# Patient Record
Sex: Female | Born: 1980 | Hispanic: No | Smoking: Former smoker
Health system: Southern US, Community
[De-identification: ages and names within clinical notes are randomized; demographics above are authoritative.]

## PROBLEM LIST (undated history)

## (undated) DIAGNOSIS — D219 Benign neoplasm of connective and other soft tissue, unspecified: Secondary | ICD-10-CM

## (undated) HISTORY — DX: Benign neoplasm of connective and other soft tissue, unspecified: D21.9

---

## 2004-02-09 ENCOUNTER — Other Ambulatory Visit: Admission: RE | Admit: 2004-02-09 | Discharge: 2004-02-09 | Payer: Self-pay | Admitting: Family Medicine

## 2010-10-22 ENCOUNTER — Encounter: Payer: Self-pay | Admitting: Orthopaedic Surgery

## 2020-04-12 ENCOUNTER — Telehealth: Payer: Self-pay | Admitting: *Deleted

## 2020-04-12 NOTE — Telephone Encounter (Signed)
Returned call from 04/11/2020 at 9:55 AM. Left patient a message to call and schedule at [facility] (due to address) or [place] location.

## 2020-04-19 ENCOUNTER — Telehealth: Payer: Self-pay | Admitting: *Deleted

## 2020-04-19 NOTE — Telephone Encounter (Signed)
Left patient a message that I will call back to schedule her New OB appointment, phones are forwarded for lunch at the time of call.

## 2020-04-29 ENCOUNTER — Other Ambulatory Visit (HOSPITAL_COMMUNITY)
Admission: RE | Admit: 2020-04-29 | Discharge: 2020-04-29 | Disposition: A | Discharge status: Home / Self Care | Payer: Medicaid Other | Source: Ambulatory Visit

## 2020-04-29 ENCOUNTER — Other Ambulatory Visit: Payer: Self-pay

## 2020-04-29 ENCOUNTER — Ambulatory Visit (INDEPENDENT_AMBULATORY_CARE_PROVIDER_SITE_OTHER): Payer: Medicaid Other

## 2020-04-29 DIAGNOSIS — O09529 Supervision of elderly multigravida, unspecified trimester: Secondary | ICD-10-CM

## 2020-04-29 DIAGNOSIS — Z3480 Encounter for supervision of other normal pregnancy, unspecified trimester: Secondary | ICD-10-CM | POA: Diagnosis present

## 2020-04-29 NOTE — Progress Notes (Signed)
Subjective:   Laurie Scott is a 39 y.o. [id number] at Unknown by early ultrasound being seen today for her first obstetrical visit.  Her obstetrical history is significant for advanced maternal age and has Supervision of other normal pregnancy, antepartum and AMA (advanced maternal age) multigravida 35+ on their problem list.. Patient does intend to breast feed. Pregnancy history fully reviewed.  Patient reports no complaints.  HISTORY: OB History  Gravida Para Term Preterm AB Living  6 2 2  0 3 2  SAB TAB Ectopic Multiple Live Births  1 2 0 0 0    # Outcome Date GA Lbr Len/2nd Weight Sex Delivery Anes PTL Lv  6 Current           5 SAB           4 TAB           3 TAB           2 Term           1 Term            History reviewed. No pertinent past medical history. History reviewed. No pertinent surgical history. Family History  Problem Relation Age of Onset  . Breast cancer Other    Social History   Tobacco Use  . Smoking status: Former Research scientist (life sciences)  . Smokeless tobacco: Never Used  Vaping Use  . Vaping Use: Never used  Substance Use Topics  . Alcohol use: Not Currently  . Drug use: Never   Not on File No current outpatient medications on file prior to visit.   No current facility-administered medications on file prior to visit.     Exam   Vitals:   04/29/20 1007  BP: (!) 134/86  Pulse: 94  Weight: 154 lb (69.9 kg)      Uterus:     Pelvic Exam: deferred                       System: General: well-developed, well-nourished female in no acute distress   Breast:  normal appearance, no masses or tenderness   Skin: normal coloration and turgor, no rashes   Neurologic: oriented, normal, negative, normal mood   Extremities: normal strength, tone, and muscle mass, ROM of all joints is normal   HEENT PERRLA, extraocular movement intact and sclera clear, anicteric   Mouth/Teeth mucous membranes moist, pharynx normal without lesions and dental hygiene good    Neck supple and no masses   Cardiovascular: regular rate and rhythm   Respiratory:  no respiratory distress, normal breath sounds   Abdomen: soft, non-tender; bowel sounds normal; no masses,  no organomegaly     Assessment:   Pregnancy: [id number] Patient Active Problem List   Diagnosis Date Noted  . Supervision of other normal pregnancy, antepartum 04/29/2020  . AMA (advanced maternal age) multigravida 35+ 04/29/2020     Plan:  1. Supervision of other normal pregnancy, antepartum - No complaints. Routine care - Patient had early u/s at pregnancy care center. Patient brought copy of what appears to be first trimester ultrasound after appointment and left. Dating at the top is crossed out with marker and "8w" is written above. No measurements on actual image- see scan in chart. Will need more accurate dating confirmed.   - Patient declines NOB labs today stating she hasn't eaten. Will come in for lab only visit ASAP - Discussed genetic screening at length, patient unsure at this time and  will let us know if she wants to have this done.  -BP borderline today, watch closely - Considering waterbirth- reviewed requirements and contraindications  - Culture, OB Urine - GC/Chlamydia probe amp ([place])not at [facility]  2. Antepartum multigravida of advanced maternal age -Discussed aspirin use in pregnancy >12 weeks   Continue prenatal vitamins. Genetic Screening discussed, First trimester screen, Quad screen and NIPS: unsure. Ultrasound discussed; fetal anatomic survey: requested. Problem list reviewed and updated. The nature of [place] with multiple MDs and other Advanced Practice Providers was explained to patient; also emphasized that residents, students are part of our team. Routine obstetric precautions reviewed. Return in about 4 weeks (around 05/27/2020) for Return OB visit.   Wende Mott, CNM 04/29/20 10:30 AM

## 2020-04-29 NOTE — Patient Instructions (Signed)

## 2020-05-01 LAB — CULTURE, OB URINE

## 2020-05-01 LAB — URINE CULTURE, OB REFLEX

## 2020-05-02 ENCOUNTER — Ambulatory Visit: Payer: Medicaid Other

## 2020-05-02 LAB — GC/CHLAMYDIA PROBE AMP (~~LOC~~) NOT AT ARMC
Chlamydia: NEGATIVE
Comment: NEGATIVE
Comment: NORMAL
Neisseria Gonorrhea: NEGATIVE

## 2020-05-26 ENCOUNTER — Encounter: Payer: Self-pay | Admitting: Certified Nurse Midwife

## 2020-05-27 ENCOUNTER — Encounter: Payer: Medicaid Other | Admitting: Certified Nurse Midwife

## 2020-06-17 ENCOUNTER — Encounter: Payer: Self-pay | Admitting: Certified Nurse Midwife

## 2020-06-17 ENCOUNTER — Ambulatory Visit (INDEPENDENT_AMBULATORY_CARE_PROVIDER_SITE_OTHER): Payer: Medicaid Other | Admitting: Certified Nurse Midwife

## 2020-06-17 ENCOUNTER — Other Ambulatory Visit: Payer: Self-pay

## 2020-06-17 ENCOUNTER — Telehealth: Payer: Self-pay

## 2020-06-17 VITALS — BP 108/64 | HR 80 | Ht 64.0 in | Wt 157.0 lb

## 2020-06-17 DIAGNOSIS — Z3480 Encounter for supervision of other normal pregnancy, unspecified trimester: Secondary | ICD-10-CM

## 2020-06-17 DIAGNOSIS — O09522 Supervision of elderly multigravida, second trimester: Secondary | ICD-10-CM

## 2020-06-17 NOTE — Progress Notes (Signed)
LM on voicemail to call office to schedule her PN labs before her anatomy scans.

## 2020-06-17 NOTE — Telephone Encounter (Signed)
Left voicemail on pt's phone letting her know that when she was in the office scheduling her next appt I accidentally scheduled her 3 weeks from today when it needed to be 4 weeks from today. I let pt know that I changed appt from 10/8 to 10/15 at the same time to stay on prenatal schedule.Asked pt to call office back if that day and time didn't work for her.  MyChart message also sent.

## 2020-06-17 NOTE — Progress Notes (Signed)
Subjective:  Laurie Scott is a 39 y.o. [id number] at [redacted]w[redacted]d being seen today for ongoing prenatal care.  She is currently monitored for the following issues for this high-risk pregnancy and has Supervision of other normal pregnancy, antepartum and AMA (advanced maternal age) multigravida 35+ on their problem list.  Patient reports no complaints.  Contractions: Not present. Vag. Bleeding: None.  Movement: Present. Denies leaking of fluid.   The following portions of the patient's history were reviewed and updated as appropriate: allergies, current medications, past family history, past medical history, past social history, past surgical history and problem list. Problem list updated.  Objective:   Vitals:   06/17/20 0918 06/17/20 0926  BP: 108/64   Pulse: 80   Weight: 157 lb (71.2 kg)   Height:  5\' 4"  (1.626 m)    Fetal Status: Fetal Heart Rate (bpm): 141 Fundal Height: 19 cm Movement: Present     General:  Alert, oriented and cooperative. Patient is in no acute distress.  Skin: Skin is warm and dry. No rash noted.   Cardiovascular: Normal heart rate noted  Respiratory: Normal respiratory effort, no problems with respiration noted  Abdomen: Soft, gravid, appropriate for gestational age. Pain/Pressure: Absent     Pelvic: Vag. Bleeding: None Vag D/C Character: Thick   Cervical exam deferred        Extremities: Normal range of motion.  Edema: None  Mental Status: Normal mood and affect. Normal behavior. Normal judgment and thought content.   Urinalysis:      Assessment and Plan:  Pregnancy: [id number] at [redacted]w[redacted]d  1. Supervision of other normal pregnancy, antepartum - [place] MFM OB DETAIL +14 WK; Future - NOB labs- declined last visit  2. Multigravida of advanced maternal age in second trimester  Preterm labor symptoms and general obstetric precautions including but not limited to vaginal bleeding, contractions, leaking of fluid and fetal movement were reviewed in detail with the  patient. Please refer to After Visit Summary for other counseling recommendations.  Return in about 4 weeks (around 07/15/2020).   Julianne Handler, CNM

## 2020-06-21 ENCOUNTER — Telehealth: Payer: Self-pay | Admitting: *Deleted

## 2020-06-21 NOTE — Telephone Encounter (Signed)
Left patient an urgent message that her appointment is scheduled for Friday, July 15, 2020 at 9:10 AM as a virtual, MyChart visit with Julianne Handler not Friday, July 08, 2020 with Gaylan Gerold. Patient was left a voicemail and MyChart message on 06/17/2020 as well, with this same information. Patient has not read MyChart message as of this time today.

## 2020-06-30 ENCOUNTER — Ambulatory Visit: Payer: Medicaid Other | Admitting: *Deleted

## 2020-06-30 ENCOUNTER — Other Ambulatory Visit: Payer: Self-pay

## 2020-06-30 ENCOUNTER — Ambulatory Visit: Payer: Medicaid Other | Attending: Certified Nurse Midwife

## 2020-06-30 ENCOUNTER — Other Ambulatory Visit: Payer: Self-pay | Admitting: *Deleted

## 2020-06-30 VITALS — BP 123/86 | HR 88

## 2020-06-30 DIAGNOSIS — Z3480 Encounter for supervision of other normal pregnancy, unspecified trimester: Secondary | ICD-10-CM | POA: Insufficient documentation

## 2020-06-30 DIAGNOSIS — O09529 Supervision of elderly multigravida, unspecified trimester: Secondary | ICD-10-CM

## 2020-06-30 DIAGNOSIS — O409XX0 Polyhydramnios, unspecified trimester, not applicable or unspecified: Secondary | ICD-10-CM | POA: Diagnosis present

## 2020-07-01 DIAGNOSIS — O409XX0 Polyhydramnios, unspecified trimester, not applicable or unspecified: Secondary | ICD-10-CM | POA: Insufficient documentation

## 2020-07-08 ENCOUNTER — Telehealth: Payer: Medicaid Other | Admitting: Certified Nurse Midwife

## 2020-07-15 ENCOUNTER — Telehealth: Payer: Self-pay

## 2020-07-15 ENCOUNTER — Encounter: Payer: Medicaid Other | Admitting: Certified Nurse Midwife

## 2020-07-15 VITALS — Wt 162.0 lb

## 2020-07-15 DIAGNOSIS — O409XX0 Polyhydramnios, unspecified trimester, not applicable or unspecified: Secondary | ICD-10-CM

## 2020-07-15 DIAGNOSIS — Z3A25 25 weeks gestation of pregnancy: Secondary | ICD-10-CM

## 2020-07-15 NOTE — Progress Notes (Signed)
DNKA. Erroneous encounter. This encounter was created in error - please disregard.

## 2020-07-15 NOTE — Telephone Encounter (Signed)
Pt was on MyChart for virtual visit and then logged off. Called pt to ask her to get back on mychart visit. Pt didn't answer. VM left asking pt to call office.

## 2020-07-19 ENCOUNTER — Encounter: Payer: Medicaid Other | Admitting: Certified Nurse Midwife

## 2020-07-28 ENCOUNTER — Ambulatory Visit: Payer: Medicaid Other

## 2020-08-08 ENCOUNTER — Ambulatory Visit: Payer: Medicaid Other

## 2020-08-12 ENCOUNTER — Other Ambulatory Visit: Payer: Self-pay

## 2020-08-12 ENCOUNTER — Ambulatory Visit: Payer: Medicaid Other | Attending: Certified Nurse Midwife

## 2020-08-12 ENCOUNTER — Encounter: Payer: Self-pay | Admitting: *Deleted

## 2020-08-12 ENCOUNTER — Ambulatory Visit: Payer: Medicaid Other | Admitting: *Deleted

## 2020-08-12 DIAGNOSIS — O409XX0 Polyhydramnios, unspecified trimester, not applicable or unspecified: Secondary | ICD-10-CM

## 2020-08-12 DIAGNOSIS — Z362 Encounter for other antenatal screening follow-up: Secondary | ICD-10-CM

## 2020-08-12 DIAGNOSIS — Z3A29 29 weeks gestation of pregnancy: Secondary | ICD-10-CM | POA: Diagnosis not present

## 2020-08-12 DIAGNOSIS — O09523 Supervision of elderly multigravida, third trimester: Secondary | ICD-10-CM | POA: Diagnosis not present

## 2020-08-12 DIAGNOSIS — O3410 Maternal care for benign tumor of corpus uteri, unspecified trimester: Secondary | ICD-10-CM

## 2020-08-12 DIAGNOSIS — O09529 Supervision of elderly multigravida, unspecified trimester: Secondary | ICD-10-CM | POA: Insufficient documentation

## 2020-08-15 ENCOUNTER — Other Ambulatory Visit: Payer: Self-pay | Admitting: *Deleted

## 2020-08-15 DIAGNOSIS — D219 Benign neoplasm of connective and other soft tissue, unspecified: Secondary | ICD-10-CM

## 2020-09-13 ENCOUNTER — Encounter: Payer: Self-pay | Admitting: *Deleted

## 2020-09-13 ENCOUNTER — Ambulatory Visit: Payer: Medicaid Other | Attending: Obstetrics and Gynecology

## 2020-09-13 ENCOUNTER — Other Ambulatory Visit: Payer: Self-pay

## 2020-09-13 ENCOUNTER — Ambulatory Visit: Payer: Medicaid Other | Admitting: *Deleted

## 2020-09-13 DIAGNOSIS — D259 Leiomyoma of uterus, unspecified: Secondary | ICD-10-CM

## 2020-09-13 DIAGNOSIS — O409XX0 Polyhydramnios, unspecified trimester, not applicable or unspecified: Secondary | ICD-10-CM | POA: Diagnosis present

## 2020-09-13 DIAGNOSIS — O3413 Maternal care for benign tumor of corpus uteri, third trimester: Secondary | ICD-10-CM | POA: Diagnosis not present

## 2020-09-13 DIAGNOSIS — D219 Benign neoplasm of connective and other soft tissue, unspecified: Secondary | ICD-10-CM | POA: Diagnosis not present

## 2020-09-13 DIAGNOSIS — O09523 Supervision of elderly multigravida, third trimester: Secondary | ICD-10-CM | POA: Diagnosis not present

## 2020-09-13 DIAGNOSIS — Z362 Encounter for other antenatal screening follow-up: Secondary | ICD-10-CM

## 2020-09-13 DIAGNOSIS — Z3A33 33 weeks gestation of pregnancy: Secondary | ICD-10-CM

## 2020-09-14 ENCOUNTER — Other Ambulatory Visit: Payer: Self-pay | Admitting: *Deleted

## 2020-09-14 DIAGNOSIS — O09523 Supervision of elderly multigravida, third trimester: Secondary | ICD-10-CM

## 2020-10-01 NOTE — L&D Delivery Note (Signed)
OB/GYN Faculty Practice Delivery Note  Laurie Scott is a 40 y.o. [id number] s/p vaginal delivery at [redacted]w[redacted]d. She was admitted for spontaneous onset of labor.  ROM: 0h 61m with clear fluid GBS Status: negative Maximum Maternal Temperature: 98.46F  Labor Progress: Pt presented to MAU for labor check. She rapidly progressed to complete cervical dilation with SROM shortly after arrival to L&D. She then precipitously had an uncomplicated delivery as noted below.  Delivery Date/Time: 10/30/20 at 2123 Delivery: Called to room and patient was complete and pushing. Head delivered ROA. Nuchal cord x1 present; reduced s/p delivery. Shoulder and body delivered in usual fashion. Infant with spontaneous cry, placed on mother's abdomen, dried and stimulated. Cord clamped x 2 after 1-minute delay, and cut by FOB under my direct supervision. Cord blood drawn. Placenta delivered spontaneously with gentle cord traction. Fundus firm with massage and Pitocin. Labia, perineum, vagina, and cervix were inspected, notable for hemostatic right periurethral laceration without need for repair.   Placenta: 3-vessel cord, intact, sent to L&D Complications: none Lacerations: hemostatic right periurethral laceration without repair EBL: 25 ml Analgesia: IV fentanryl  Infant: female  APGARs 9 & 9  weight per medical record  Laurie Scott, Laurie Cranker, MD OB Fellow, Faculty Practice 10/30/2020 9:55 PM

## 2020-10-04 ENCOUNTER — Ambulatory Visit (HOSPITAL_BASED_OUTPATIENT_CLINIC_OR_DEPARTMENT_OTHER): Payer: Medicaid Other

## 2020-10-04 ENCOUNTER — Other Ambulatory Visit (HOSPITAL_COMMUNITY): Payer: Self-pay | Admitting: Maternal & Fetal Medicine

## 2020-10-04 ENCOUNTER — Other Ambulatory Visit: Payer: Self-pay

## 2020-10-04 ENCOUNTER — Ambulatory Visit: Payer: Medicaid Other | Attending: Obstetrics and Gynecology | Admitting: *Deleted

## 2020-10-04 ENCOUNTER — Encounter: Payer: Self-pay | Admitting: *Deleted

## 2020-10-04 DIAGNOSIS — O3413 Maternal care for benign tumor of corpus uteri, third trimester: Secondary | ICD-10-CM

## 2020-10-04 DIAGNOSIS — D259 Leiomyoma of uterus, unspecified: Secondary | ICD-10-CM | POA: Diagnosis not present

## 2020-10-04 DIAGNOSIS — Z3A36 36 weeks gestation of pregnancy: Secondary | ICD-10-CM | POA: Insufficient documentation

## 2020-10-04 DIAGNOSIS — Z362 Encounter for other antenatal screening follow-up: Secondary | ICD-10-CM

## 2020-10-04 DIAGNOSIS — O09523 Supervision of elderly multigravida, third trimester: Secondary | ICD-10-CM

## 2020-10-04 DIAGNOSIS — O409XX0 Polyhydramnios, unspecified trimester, not applicable or unspecified: Secondary | ICD-10-CM

## 2020-10-05 ENCOUNTER — Ambulatory Visit: Payer: Medicaid Other

## 2020-10-12 ENCOUNTER — Ambulatory Visit: Payer: Medicaid Other

## 2020-10-12 ENCOUNTER — Ambulatory Visit: Payer: Medicaid Other | Attending: Obstetrics and Gynecology

## 2020-10-18 ENCOUNTER — Other Ambulatory Visit (HOSPITAL_COMMUNITY)
Admission: RE | Admit: 2020-10-18 | Discharge: 2020-10-18 | Disposition: A | Discharge status: Home / Self Care | Payer: Medicaid Other | Source: Ambulatory Visit | Attending: Advanced Practice Midwife | Admitting: Advanced Practice Midwife

## 2020-10-18 ENCOUNTER — Other Ambulatory Visit: Payer: Self-pay

## 2020-10-18 ENCOUNTER — Ambulatory Visit (INDEPENDENT_AMBULATORY_CARE_PROVIDER_SITE_OTHER): Payer: Medicaid Other | Admitting: Advanced Practice Midwife

## 2020-10-18 ENCOUNTER — Ambulatory Visit: Payer: Medicaid Other

## 2020-10-18 VITALS — BP 120/74 | HR 78 | Wt 172.0 lb

## 2020-10-18 DIAGNOSIS — Z3480 Encounter for supervision of other normal pregnancy, unspecified trimester: Secondary | ICD-10-CM | POA: Insufficient documentation

## 2020-10-18 DIAGNOSIS — O09522 Supervision of elderly multigravida, second trimester: Secondary | ICD-10-CM

## 2020-10-18 DIAGNOSIS — O99613 Diseases of the digestive system complicating pregnancy, third trimester: Secondary | ICD-10-CM

## 2020-10-18 DIAGNOSIS — O0933 Supervision of pregnancy with insufficient antenatal care, third trimester: Secondary | ICD-10-CM

## 2020-10-18 DIAGNOSIS — K5900 Constipation, unspecified: Secondary | ICD-10-CM

## 2020-10-18 DIAGNOSIS — Z3A38 38 weeks gestation of pregnancy: Secondary | ICD-10-CM

## 2020-10-18 MED ORDER — POLYETHYLENE GLYCOL 3350 17 GM/SCOOP PO POWD: 17.0000 g | Freq: Every day | ORAL | 0 refills | Status: DC

## 2020-10-18 MED ORDER — DOCUSATE SODIUM 100 MG PO CAPS: 100.0000 mg | ORAL_CAPSULE | Freq: Two times a day (BID) | ORAL | 2 refills | Status: DC | PRN

## 2020-10-18 NOTE — Progress Notes (Signed)
Pt c/o constipation. 

## 2020-10-18 NOTE — Patient Instructions (Signed)
Things to Try After 37 weeks to Encourage Labor/Get Ready for Labor:   1.  Try the [employer] at [email or web address] daily to improve baby's position and encourage the onset of labor.  2. Walk a little and rest a little every day.  Change positions often.  3. Cervical Ripening: May try one or both a. Red Raspberry Leaf capsules or tea:  two 300mg  or 400mg  tablets with each meal, 2-3 times a day, or 1-3 cups of tea daily  Potential Side Effects Of Raspberry Leaf:  Most women do not experience any side effects from drinking raspberry leaf tea. However, nausea and loose stools are possible   b. Evening Primrose Oil capsules: take 1 capsule by mouth and place one capsule in the vagina every night.    Some of the potential side effects:  Upset stomach  Loose stools or diarrhea  Headaches  Nausea  4. Sex can also help the cervix ripen and encourage labor onset.    Reasons to return to MAU at [facility] and [place]:  1.  Contractions are  5 minutes apart or less, each last 1 minute, these have been going on for 1-2 hours, and you cannot walk or talk during them 2.  You have a large gush of fluid, or a trickle of fluid that will not stop and you have to wear a pad 3.  You have bleeding that is bright red, heavier than spotting--like menstrual bleeding (spotting can be normal in early labor or after a check of your cervix) 4.  You do not feel the baby moving like he/she normally does

## 2020-10-18 NOTE — Progress Notes (Signed)
PRENATAL VISIT NOTE  Subjective:  Laurie Scott is a 40 y.o. K2H0623 at [redacted]w[redacted]d being seen today for ongoing prenatal care.  She is currently monitored for the following issues for this low-risk pregnancy and has Supervision of other normal pregnancy, antepartum; AMA (advanced maternal age) multigravida 35+; and Polyhydramnios affecting pregnancy on their problem list.  Patient reports no complaints.  Contractions: Not present. Vag. Bleeding: None.  Movement: Present. Denies leaking of fluid.   The following portions of the patient's history were reviewed and updated as appropriate: allergies, current medications, past family history, past medical history, past social history, past surgical history and problem list.   Objective:   Vitals:   10/18/20 1358  BP: 120/74  Pulse: 78  Weight: 172 lb (78 kg)    Fetal Status: Fetal Heart Rate (bpm): 142   Movement: Present     General:  Alert, oriented and cooperative. Patient is in no acute distress.  Skin: Skin is warm and dry. No rash noted.   Cardiovascular: Normal heart rate noted  Respiratory: Normal respiratory effort, no problems with respiration noted  Abdomen: Soft, gravid, appropriate for gestational age.  Pain/Pressure: Absent     Pelvic: Cervical exam deferred        Extremities: Normal range of motion.  Edema: None  Mental Status: Normal mood and affect. Normal behavior. Normal judgment and thought content.   Assessment and Plan:  Pregnancy: [id number] at [redacted]w[redacted]d 1. Constipation during pregnancy in third trimester --Pt reports 1 BM per week for last few weeks and is uncomfortable and having hard stools with straining when she does go.   --Increase fluids and fiber intake - docusate sodium (COLACE) 100 MG capsule; Take 1 capsule (100 mg total) by mouth 2 (two) times daily as needed.  Dispense: 30 capsule; Refill: 2 - polyethylene glycol powder (GLYCOLAX/MIRALAX) 17 GM/SCOOP powder; Take 17 g by mouth daily.  Dispense: 255 g;  Refill: 0  2. Supervision of other normal pregnancy, antepartum --Anticipatory guidance about next visits/weeks of pregnancy given. --Pt interested in waterbirth. She has lapse in care but there was some confusion about visits. She was going to MFM for antenatal testing due to [facility] and reports she did not know she had to keep coming to [place] office. --36 week labs done, apparently no labs done prior during pregnancy, so pt needs NOB labs and glucose testing.  No hx GDM or macrosomia, pt pregravid BMI 26.  She is AMA so this is her only risk factor.  Pt does not desire 2 hour testing. Pt scheduled for lab only visit 1/20 for NOB labs and 1 hour GTT. Pt aware that elevated 1 hour requires 3 hour test and waterbirth may not be available given lapse in care and no lab results. --No other barriers to waterbirth at this time, so consent signed and will message Cyril Mourning to see if she can do private class at her convenience.  --Next visit in 1 week in office with midwife  3. Multigravida of advanced maternal age in second trimester --BPP and growth wnl  4. [redacted] weeks gestation of pregnancy   5. Insufficient prenatal care in third trimester --Lapse in care, pt reports she did not know that MFM was not providing her prenatal care and did not realize she needed to be seen in both offices.  Term labor symptoms and general obstetric precautions including but not limited to vaginal bleeding, contractions, leaking of fluid and fetal movement were reviewed in detail with the patient.  Please refer to After Visit Summary for other counseling recommendations.   Return in about 1 week (around 10/25/2020).  Future Appointments  Date Time Provider [place]  10/19/2020  9:30 AM [facility] NURSE [facility]  10/19/2020  9:45 AM [facility] US[facility] [place]    Fatima Blank, CNM

## 2020-10-19 ENCOUNTER — Other Ambulatory Visit: Payer: Medicaid Other

## 2020-10-19 ENCOUNTER — Ambulatory Visit: Payer: Medicaid Other | Attending: Obstetrics and Gynecology

## 2020-10-20 ENCOUNTER — Other Ambulatory Visit: Payer: Self-pay

## 2020-10-20 ENCOUNTER — Other Ambulatory Visit (INDEPENDENT_AMBULATORY_CARE_PROVIDER_SITE_OTHER): Payer: Medicaid Other

## 2020-10-20 DIAGNOSIS — Z3A38 38 weeks gestation of pregnancy: Secondary | ICD-10-CM

## 2020-10-20 LAB — CERVICOVAGINAL ANCILLARY ONLY
Chlamydia: NEGATIVE
Comment: NEGATIVE
Comment: NORMAL
Neisseria Gonorrhea: NEGATIVE

## 2020-10-20 NOTE — Progress Notes (Signed)
Lab employee came to [facility] at 11:30 am to let us know that pt refused labs

## 2020-10-20 NOTE — Progress Notes (Signed)
Pt given lab orders and sent to lab

## 2020-10-21 LAB — CULTURE, BETA STREP (GROUP B ONLY)
MICRO NUMBER:: 11430837
SPECIMEN QUALITY:: ADEQUATE

## 2020-10-25 ENCOUNTER — Encounter: Payer: Self-pay | Admitting: Certified Nurse Midwife

## 2020-10-25 ENCOUNTER — Ambulatory Visit (INDEPENDENT_AMBULATORY_CARE_PROVIDER_SITE_OTHER): Payer: Medicaid Other | Admitting: Certified Nurse Midwife

## 2020-10-25 ENCOUNTER — Other Ambulatory Visit: Payer: Self-pay

## 2020-10-25 VITALS — BP 120/76 | HR 92 | Wt 174.0 lb

## 2020-10-25 DIAGNOSIS — Z3A39 39 weeks gestation of pregnancy: Secondary | ICD-10-CM

## 2020-10-25 DIAGNOSIS — O409XX0 Polyhydramnios, unspecified trimester, not applicable or unspecified: Secondary | ICD-10-CM

## 2020-10-25 DIAGNOSIS — O09523 Supervision of elderly multigravida, third trimester: Secondary | ICD-10-CM

## 2020-10-25 DIAGNOSIS — O0933 Supervision of pregnancy with insufficient antenatal care, third trimester: Secondary | ICD-10-CM | POA: Insufficient documentation

## 2020-10-25 DIAGNOSIS — Z3480 Encounter for supervision of other normal pregnancy, unspecified trimester: Secondary | ICD-10-CM

## 2020-10-25 NOTE — Patient Instructions (Signed)
  Cervical Ripening (to get your cervix ready for labor) : May try one or all:  Red Raspberry Leaf capsules:  two 300mg or 400mg tablets with each meal, 2-3 times a day  Potential Side Effects Of Raspberry Leaf:  Most women do not experience any side effects from drinking raspberry leaf tea. However, nausea and loose stools are possible     Evening Primrose Oil capsules: may take 1 to 3 capsules daily. May also prick one to release the oil and insert it into your vagina at night.  Some of the potential side effects:  Upset stomach  Loose stools or diarrhea  Headaches  Nausea   4 Dates a day (may taste better if warmed in microwave until soft). Found where raisins are in the grocery store   Reasons to go to MAU:  1.  Contractions are  5 minutes apart or less, each last 1 minute, these have been going on for 1-2 hours, and you cannot walk or talk during them 2.  You have a large gush of fluid, or a trickle of fluid that will not stop and you have to wear a pad 3.  You have bleeding that is bright red, heavier than spotting--like menstrual bleeding (spotting can be normal in early labor or after a check of your cervix) 4.  You do not feel the baby moving like he/she normally does  

## 2020-10-25 NOTE — Progress Notes (Signed)
   PRENATAL VISIT NOTE  Subjective:  Laurie Scott is a 40 y.o. [id number] at [redacted]w[redacted]d being seen today for ongoing prenatal care.  She is currently monitored for the following issues for this high-risk pregnancy and has Supervision of other normal pregnancy, antepartum; AMA (advanced maternal age) multigravida 76+; Polyhydramnios affecting pregnancy; and Insufficient prenatal care, third trimester on their problem list.  Patient reports no complaints.  Contractions: Not present. Vag. Bleeding: None.  Movement: Present. Denies leaking of fluid.   The following portions of the patient's history were reviewed and updated as appropriate: allergies, current medications, past family history, past medical history, past social history, past surgical history and problem list.   Objective:   Vitals:   10/25/20 1115  BP: 120/76  Pulse: 92  Weight: 174 lb (78.9 kg)    Fetal Status: Fetal Heart Rate (bpm): 129 Fundal Height: 34 cm Movement: Present     General:  Alert, oriented and cooperative. Patient is in no acute distress.  Skin: Skin is warm and dry. No rash noted.   Cardiovascular: Normal heart rate noted  Respiratory: Normal respiratory effort, no problems with respiration noted  Abdomen: Soft, gravid, appropriate for gestational age.  Pain/Pressure: Present     Pelvic: Cervical exam deferred        Extremities: Normal range of motion.  Edema: None  Mental Status: Normal mood and affect. Normal behavior. Normal judgment and thought content.   Assessment and Plan:  Pregnancy: [id number] at [redacted]w[redacted]d 1. Polyhydramnios affecting pregnancy - patient has been no show to MFM appointments and canceling appointments - last MFM appointment on 10/04/2020  - patient has been noncompliant with antenatal screening   2. Supervision of other normal pregnancy, antepartum - Anticipatory guidance on upcoming appointments  - Patient wants waterbirth and has completed class on 1/20 - discussed with patient that  she will not be eligible for waterbirth if she is non compliant with care and does not complete labs - patient plans to go downstairs to complete labs at this time   3. Multigravida of advanced maternal age in third trimester - Recommends delivery at 84 weeks due to [facility]  - Patient declines induction and wants to wait until body goes into labor, discussed with patient importance of antenatal screening with NST on Friday- patient verbalizes understanding   4. [redacted] weeks gestation of pregnancy - DRUG MONITOR, PANEL 3, W/CONF, URINE - Glucose Tolerance, 1 HR (50g) - Hemoglobinopathy Evaluation - Hepatitis C Antibody - HIV antibody (with reflex) - Obstetric panel  Term labor symptoms and general obstetric precautions including but not limited to vaginal bleeding, contractions, leaking of fluid and fetal movement were reviewed in detail with the patient. Please refer to After Visit Summary for other counseling recommendations.   Return in about 3 days (around 10/28/2020) for HROB, NST, in person.  Future Appointments  Date Time Provider [place]  10/28/2020  9:00 AM [place], [place]

## 2020-10-28 ENCOUNTER — Ambulatory Visit (INDEPENDENT_AMBULATORY_CARE_PROVIDER_SITE_OTHER): Payer: Medicaid Other | Admitting: Certified Nurse Midwife

## 2020-10-28 ENCOUNTER — Other Ambulatory Visit: Payer: Self-pay

## 2020-10-28 VITALS — BP 114/69 | HR 83 | Wt 170.0 lb

## 2020-10-28 DIAGNOSIS — O09523 Supervision of elderly multigravida, third trimester: Secondary | ICD-10-CM

## 2020-10-28 DIAGNOSIS — Z3A40 40 weeks gestation of pregnancy: Secondary | ICD-10-CM | POA: Diagnosis not present

## 2020-10-28 DIAGNOSIS — Z3480 Encounter for supervision of other normal pregnancy, unspecified trimester: Secondary | ICD-10-CM

## 2020-10-28 NOTE — Patient Instructions (Signed)
Fetal Movement Counts Patient Name: ________________________________________________ Patient Due Date: ____________________  What is a fetal movement count? A fetal movement count is the number of times that you feel your baby move during a certain amount of time. This may also be called a fetal kick count. A fetal movement count is recommended for every pregnant woman. You may be asked to start counting fetal movements as early as week 28 of your pregnancy. Pay attention to when your baby is most active. You may notice your baby's sleep and wake cycles. You may also notice things that make your baby move more. You should do a fetal movement count:  When your baby is normally most active.  At the same time each day. A good time to count movements is while you are resting, after having something to eat and drink. How do I count fetal movements? 1. Find a quiet, comfortable area. Sit, or lie down on your side. 2. Write down the date, the start time and stop time, and the number of movements that you felt between those two times. Take this information with you to your health care visits. 3. Write down your start time when you feel the first movement. 4. Count kicks, flutters, swishes, rolls, and jabs. You should feel at least 10 movements. 5. You may stop counting after you have felt 10 movements, or if you have been counting for 2 hours. Write down the stop time. 6. If you do not feel 10 movements in 2 hours, contact your health care provider for further instructions. Your health care provider may want to do additional tests to assess your baby's well-being. Contact a health care provider if:  You feel fewer than 10 movements in 2 hours.  Your baby is not moving like he or she usually does. Date: ____________ Start time: ____________ Stop time: ____________ Movements: ____________ Date: ____________ Start time: ____________ Stop time: ____________ Movements: ____________ Date: ____________  Start time: ____________ Stop time: ____________ Movements: ____________ Date: ____________ Start time: ____________ Stop time: ____________ Movements: ____________ Date: ____________ Start time: ____________ Stop time: ____________ Movements: ____________ Date: ____________ Start time: ____________ Stop time: ____________ Movements: ____________ Date: ____________ Start time: ____________ Stop time: ____________ Movements: ____________ Date: ____________ Start time: ____________ Stop time: ____________ Movements: ____________ Date: ____________ Start time: ____________ Stop time: ____________ Movements: ____________ This information is not intended to replace advice given to you by your health care provider. Make sure you discuss any questions you have with your health care provider. Document Revised: 05/07/2019 Document Reviewed: 05/07/2019 Elsevier Patient Education  [place].   Rosen's Emergency Medicine: Concepts and Clinical Practice (9th ed., pp. 2296- 2312). Elsevier.">  Braxton Hicks Contractions Contractions of the uterus can occur throughout pregnancy, but they are not always a sign that you are in labor. You may have practice contractions called Braxton Hicks contractions. These false labor contractions are sometimes confused with true labor. What are [place] contractions? Braxton Hicks contractions are tightening movements that occur in the muscles of the uterus before labor. Unlike true labor contractions, these contractions do not result in opening (dilation) and thinning of the cervix. Toward the end of pregnancy (32-34 weeks), Braxton Hicks contractions can happen more often and may become stronger. These contractions are sometimes difficult to tell apart from true labor because they can be very uncomfortable. You should not feel embarrassed if you go to the hospital with false labor. Sometimes, the only way to tell if you are in true labor is  for your health care  provider to look for changes in the cervix. The health care provider will do a physical exam and may monitor your contractions. If you are not in true labor, the exam should show that your cervix is not dilating and your water has not broken. If there are no other health problems associated with your pregnancy, it is completely safe for you to be sent home with false labor. You may continue to have Braxton Hicks contractions until you go into true labor. How to tell the difference between true labor and false labor True labor  Contractions last 30-70 seconds.  Contractions become very regular.  Discomfort is usually felt in the top of the uterus, and it spreads to the lower abdomen and low back.  Contractions do not go away with walking.  Contractions usually become more intense and increase in frequency.  The cervix dilates and gets thinner. False labor  Contractions are usually shorter and not as strong as true labor contractions.  Contractions are usually irregular.  Contractions are often felt in the front of the lower abdomen and in the groin.  Contractions may go away when you walk around or change positions while lying down.  Contractions get weaker and are shorter-lasting as time goes on.  The cervix usually does not dilate or become thin. Follow these instructions at home:  Take over-the-counter and prescription medicines only as told by your health care provider.  Keep up with your usual exercises and follow other instructions from your health care provider.  Eat and drink lightly if you think you are going into labor.  If Braxton Hicks contractions are making you uncomfortable: ? Change your position from lying down or resting to walking, or change from walking to resting. ? Sit and rest in a tub of warm water. ? Drink enough fluid to keep your urine pale yellow. Dehydration may cause these contractions. ? Do slow and deep breathing several times an hour.  Keep  all follow-up prenatal visits as told by your health care provider. This is important.   Contact a health care provider if:  You have a fever.  You have continuous pain in your abdomen. Get help right away if:  Your contractions become stronger, more regular, and closer together.  You have fluid leaking or gushing from your vagina.  You pass blood-tinged mucus (bloody show).  You have bleeding from your vagina.  You have low back pain that you never had before.  You feel your baby's head pushing down and causing pelvic pressure.  Your baby is not moving inside you as much as it used to. Summary  Contractions that occur before labor are called Braxton Hicks contractions, false labor, or practice contractions.  Braxton Hicks contractions are usually shorter, weaker, farther apart, and less regular than true labor contractions. True labor contractions usually become progressively stronger and regular, and they become more frequent.  Manage discomfort from Braxton Hicks contractions by changing position, resting in a warm bath, drinking plenty of water, or practicing deep breathing. This information is not intended to replace advice given to you by your health care provider. Make sure you discuss any questions you have with your health care provider. Document Revised: 08/30/2017 Document Reviewed: 01/31/2017 Elsevier Patient Education  2021 Elsevier Inc.  

## 2020-10-28 NOTE — Progress Notes (Signed)
Subjective:  Laurie Scott is a 40 y.o. [id number] at [redacted]w[redacted]d being seen today for ongoing prenatal care.  She is currently monitored for the following issues for this high-risk pregnancy and has Supervision of other normal pregnancy, antepartum; AMA (advanced maternal age) multigravida 59+; Polyhydramnios affecting pregnancy; and Insufficient prenatal care, third trimester on their problem list.  Patient reports no complaints.  Contractions: Not present. Vag. Bleeding: None.  Movement: Present. Denies leaking of fluid.   The following portions of the patient's history were reviewed and updated as appropriate: allergies, current medications, past family history, past medical history, past social history, past surgical history and problem list. Problem list updated.  Objective:   Vitals:   10/28/20 0926  BP: 114/69  Pulse: 83  Weight: 170 lb (77.1 kg)    Fetal Status: Fetal Heart Rate (bpm): 125 Fundal Height: 38 cm Movement: Present  Presentation: Vertex  General:  Alert, oriented and cooperative. Patient is in no acute distress.  Skin: Skin is warm and dry. No rash noted.   Cardiovascular: Normal heart rate noted  Respiratory: Normal respiratory effort, no problems with respiration noted  Abdomen: Soft, gravid, appropriate for gestational age. Pain/Pressure: Present     Pelvic: Vag. Bleeding: None Vag D/C Character: Thin   Cervical exam deferred        Extremities: Normal range of motion.  Edema: None  Mental Status: Normal mood and affect. Normal behavior. Normal judgment and thought content.   Urinalysis:      Assessment and Plan:  Pregnancy: [id number] at [redacted]w[redacted]d  1. [redacted] weeks gestation of pregnancy - Obstetric panel - HIV antibody (with reflex) - Hepatitis C Antibody - Hemoglobinopathy Evaluation - HgB A1c - DRUG MONITOR, PANEL 3, W/CONF, URINE  2. Supervision of other normal pregnancy, antepartum - has not been compliant with getting OB labs drawn and AT with MFM, states she  has been busy and working 7 days a week, labs were drawn today in office, A1C added in place of GTT - desires WB, had class with Cyril Mourning, she will scan in certificate to [place] she is a candidate only if everything remains normal including OB lab results, fetal status upon admission, maternal status, etc, and must comply with care - she declines IOL for AMA, wants to wait another week for spontaneous labor; discussed increased risk of stillbirth in women over 29 and recommendation for IOL by 40 weeks, she accepts the risk - discussed labor prep of EPO and RRLT - NST reactive today with AFI of 10 - recommend twice weekly NST next week, and IOL no later than 41 weeks - she understands that IOL may prevent her from [facility]  Term labor symptoms and general obstetric precautions including but not limited to vaginal bleeding, contractions, leaking of fluid and fetal movement were reviewed in detail with the patient. Please refer to After Visit Summary for other counseling recommendations.  Return in about 3 days (around 10/31/2020).   Julianne Handler, CNM

## 2020-10-30 ENCOUNTER — Other Ambulatory Visit: Payer: Self-pay

## 2020-10-30 ENCOUNTER — Inpatient Hospital Stay (HOSPITAL_COMMUNITY)
Admission: AD | Admit: 2020-10-30 | Discharge: 2020-11-01 | DRG: 806 | Disposition: A | Discharge status: Home / Self Care | Payer: Medicaid Other | Attending: Obstetrics & Gynecology | Admitting: Obstetrics & Gynecology

## 2020-10-30 ENCOUNTER — Encounter: Payer: Self-pay | Admitting: Certified Nurse Midwife

## 2020-10-30 ENCOUNTER — Encounter (HOSPITAL_COMMUNITY): Payer: Self-pay | Admitting: Obstetrics & Gynecology

## 2020-10-30 DIAGNOSIS — O4202 Full-term premature rupture of membranes, onset of labor within 24 hours of rupture: Secondary | ICD-10-CM

## 2020-10-30 DIAGNOSIS — Z3A40 40 weeks gestation of pregnancy: Secondary | ICD-10-CM

## 2020-10-30 DIAGNOSIS — O3413 Maternal care for benign tumor of corpus uteri, third trimester: Secondary | ICD-10-CM | POA: Diagnosis present

## 2020-10-30 DIAGNOSIS — Z87891 Personal history of nicotine dependence: Secondary | ICD-10-CM

## 2020-10-30 DIAGNOSIS — O99320 Drug use complicating pregnancy, unspecified trimester: Secondary | ICD-10-CM | POA: Diagnosis present

## 2020-10-30 DIAGNOSIS — D251 Intramural leiomyoma of uterus: Secondary | ICD-10-CM | POA: Diagnosis present

## 2020-10-30 DIAGNOSIS — O26893 Other specified pregnancy related conditions, third trimester: Secondary | ICD-10-CM | POA: Diagnosis present

## 2020-10-30 DIAGNOSIS — O409XX0 Polyhydramnios, unspecified trimester, not applicable or unspecified: Secondary | ICD-10-CM | POA: Diagnosis present

## 2020-10-30 DIAGNOSIS — Z20822 Contact with and (suspected) exposure to covid-19: Secondary | ICD-10-CM | POA: Diagnosis present

## 2020-10-30 DIAGNOSIS — O99324 Drug use complicating childbirth: Secondary | ICD-10-CM | POA: Diagnosis present

## 2020-10-30 DIAGNOSIS — O09529 Supervision of elderly multigravida, unspecified trimester: Secondary | ICD-10-CM

## 2020-10-30 DIAGNOSIS — O0933 Supervision of pregnancy with insufficient antenatal care, third trimester: Secondary | ICD-10-CM

## 2020-10-30 DIAGNOSIS — F1290 Cannabis use, unspecified, uncomplicated: Secondary | ICD-10-CM | POA: Diagnosis present

## 2020-10-30 DIAGNOSIS — O0990 Supervision of high risk pregnancy, unspecified, unspecified trimester: Secondary | ICD-10-CM

## 2020-10-30 LAB — CBC
HCT: 40.5 % (ref 36.0–46.0)
Hemoglobin: 12.8 g/dL (ref 12.0–15.0)
MCH: 26.9 pg (ref 26.0–34.0)
MCHC: 31.6 g/dL (ref 30.0–36.0)
MCV: 85.1 fL (ref 80.0–100.0)
Platelets: 273 10*3/uL (ref 150–400)
RBC: 4.76 MIL/uL (ref 3.87–5.11)
RDW: 14.2 % (ref 11.5–15.5)
WBC: 13 10*3/uL — ABNORMAL HIGH (ref 4.0–10.5)
nRBC: 0 % (ref 0.0–0.2)

## 2020-10-30 LAB — HEPATITIS B SURFACE ANTIGEN: Hepatitis B Surface Ag: NONREACTIVE

## 2020-10-30 LAB — HIV ANTIBODY (ROUTINE TESTING W REFLEX): HIV Screen 4th Generation wRfx: NONREACTIVE

## 2020-10-30 LAB — TYPE AND SCREEN
ABO/RH(D): O POS
Antibody Screen: NEGATIVE

## 2020-10-30 LAB — SARS CORONAVIRUS 2 BY RT PCR (HOSPITAL ORDER, PERFORMED IN ~~LOC~~ HOSPITAL LAB): SARS Coronavirus 2: NEGATIVE

## 2020-10-30 MED ORDER — LIDOCAINE HCL (PF) 1 % IJ SOLN: 30.0000 mL | INTRAMUSCULAR | Status: DC | PRN

## 2020-10-30 MED ORDER — PHENYLEPHRINE 40 MCG/ML (10ML) SYRINGE FOR IV PUSH (FOR BLOOD PRESSURE SUPPORT): 80.0000 ug | PREFILLED_SYRINGE | INTRAVENOUS | Status: DC | PRN

## 2020-10-30 MED ORDER — OXYCODONE-ACETAMINOPHEN 5-325 MG PO TABS: 1.0000 | ORAL_TABLET | ORAL | Status: DC | PRN

## 2020-10-30 MED ORDER — OXYTOCIN BOLUS FROM INFUSION
333.0000 mL | Freq: Once | INTRAVENOUS | Status: AC
  Administered 2020-10-30: 333 mL via INTRAVENOUS

## 2020-10-30 MED ORDER — SIMETHICONE 80 MG PO CHEW: 80.0000 mg | CHEWABLE_TABLET | ORAL | Status: DC | PRN

## 2020-10-30 MED ORDER — DIBUCAINE (PERIANAL) 1 % EX OINT: 1.0000 "application " | TOPICAL_OINTMENT | CUTANEOUS | Status: DC | PRN

## 2020-10-30 MED ORDER — ACETAMINOPHEN 325 MG PO TABS
650.0000 mg | ORAL_TABLET | Freq: Four times a day (QID) | ORAL | Status: DC
  Administered 2020-10-31 – 2020-11-01 (×6): 650 mg via ORAL
  Filled 2020-10-30 (×6): qty 2

## 2020-10-30 MED ORDER — BENZOCAINE-MENTHOL 20-0.5 % EX AERO
1.0000 "application " | INHALATION_SPRAY | CUTANEOUS | Status: DC | PRN
  Administered 2020-10-31: 1 via TOPICAL
  Filled 2020-10-30: qty 56

## 2020-10-30 MED ORDER — LACTATED RINGERS IV SOLN: 500.0000 mL | INTRAVENOUS | Status: DC | PRN

## 2020-10-30 MED ORDER — DIPHENHYDRAMINE HCL 25 MG PO CAPS: 25.0000 mg | ORAL_CAPSULE | Freq: Four times a day (QID) | ORAL | Status: DC | PRN

## 2020-10-30 MED ORDER — SOD CITRATE-CITRIC ACID 500-334 MG/5ML PO SOLN: 30.0000 mL | ORAL | Status: DC | PRN

## 2020-10-30 MED ORDER — OXYTOCIN-SODIUM CHLORIDE 30-0.9 UT/500ML-% IV SOLN
2.5000 [IU]/h | INTRAVENOUS | Status: DC
  Filled 2020-10-30: qty 500

## 2020-10-30 MED ORDER — WITCH HAZEL-GLYCERIN EX PADS: 1.0000 "application " | MEDICATED_PAD | CUTANEOUS | Status: DC | PRN

## 2020-10-30 MED ORDER — EPHEDRINE 5 MG/ML INJ: 10.0000 mg | INTRAVENOUS | Status: DC | PRN

## 2020-10-30 MED ORDER — FENTANYL CITRATE (PF) 100 MCG/2ML IJ SOLN
100.0000 ug | Freq: Once | INTRAMUSCULAR | Status: AC
  Administered 2020-10-30: 100 ug via INTRAVENOUS

## 2020-10-30 MED ORDER — ONDANSETRON HCL 4 MG/2ML IJ SOLN: 4.0000 mg | INTRAMUSCULAR | Status: DC | PRN

## 2020-10-30 MED ORDER — FENTANYL CITRATE (PF) 100 MCG/2ML IJ SOLN
100.0000 ug | INTRAMUSCULAR | Status: DC | PRN
  Administered 2020-10-30: 100 ug via INTRAVENOUS
  Filled 2020-10-30 (×2): qty 2

## 2020-10-30 MED ORDER — FENTANYL-BUPIVACAINE-NACL 0.5-0.125-0.9 MG/250ML-% EP SOLN: 12.0000 mL/h | EPIDURAL | Status: DC | PRN

## 2020-10-30 MED ORDER — IBUPROFEN 600 MG PO TABS
600.0000 mg | ORAL_TABLET | Freq: Four times a day (QID) | ORAL | Status: DC
  Administered 2020-10-31 – 2020-11-01 (×6): 600 mg via ORAL
  Filled 2020-10-30 (×6): qty 1

## 2020-10-30 MED ORDER — TETANUS-DIPHTH-ACELL PERTUSSIS 5-2.5-18.5 LF-MCG/0.5 IM SUSY: 0.5000 mL | PREFILLED_SYRINGE | Freq: Once | INTRAMUSCULAR | Status: DC

## 2020-10-30 MED ORDER — ONDANSETRON HCL 4 MG PO TABS: 4.0000 mg | ORAL_TABLET | ORAL | Status: DC | PRN

## 2020-10-30 MED ORDER — ONDANSETRON HCL 4 MG/2ML IJ SOLN: 4.0000 mg | Freq: Four times a day (QID) | INTRAMUSCULAR | Status: DC | PRN

## 2020-10-30 MED ORDER — ACETAMINOPHEN 325 MG PO TABS: 650.0000 mg | ORAL_TABLET | ORAL | Status: DC | PRN

## 2020-10-30 MED ORDER — DIPHENHYDRAMINE HCL 50 MG/ML IJ SOLN: 12.5000 mg | INTRAMUSCULAR | Status: DC | PRN

## 2020-10-30 MED ORDER — LACTATED RINGERS IV SOLN: 500.0000 mL | Freq: Once | INTRAVENOUS | Status: DC

## 2020-10-30 MED ORDER — COCONUT OIL OIL
1.0000 "application " | TOPICAL_OIL | Status: DC | PRN
  Administered 2020-10-31: 1 via TOPICAL

## 2020-10-30 MED ORDER — OXYCODONE-ACETAMINOPHEN 5-325 MG PO TABS
2.0000 | ORAL_TABLET | ORAL | Status: DC | PRN
Start: 2020-10-30 — End: 2020-10-30

## 2020-10-30 MED ORDER — LACTATED RINGERS IV SOLN: INTRAVENOUS | Status: DC

## 2020-10-30 MED ORDER — PRENATAL MULTIVITAMIN CH
1.0000 | ORAL_TABLET | Freq: Every day | ORAL | Status: DC
  Administered 2020-10-31: 1 via ORAL
  Filled 2020-10-30: qty 1

## 2020-10-30 MED ORDER — SENNOSIDES-DOCUSATE SODIUM 8.6-50 MG PO TABS
2.0000 | ORAL_TABLET | Freq: Every day | ORAL | Status: DC
  Administered 2020-10-31: 2 via ORAL
  Filled 2020-10-30 (×2): qty 2

## 2020-10-30 NOTE — H&P (Signed)
OBSTETRIC ADMISSION HISTORY AND PHYSICAL  Laurie Scott is a 40 y.o. female [phone] with IUP at [redacted]w[redacted]d by 22 week ultrasound presenting for spontaneous onset of labor. She reports +FMs, No LOF, no VB, no blurry vision, headaches or peripheral edema, and RUQ pain.  She plans on breast feeding. She is undecided for birth control.  She received her prenatal care at [facility].  Dating: By 22 week ultrasound --->  Estimated Date of Delivery: 10/28/20  Sono:  @[redacted]w[redacted]d , CWD, normal anatomy, transverse presentation, 2661g, 23% EFW  Prenatal History/Complications:  - limited prenatal care - 2 intramural myomas - AMA  Past Medical History: Past Medical History:  Diagnosis Date  . Fibroid     Past Surgical History: History reviewed. No pertinent surgical history.  Obstetrical History: OB History as of 11/01/2020    Gravida  6   Para  3   Term  3   Preterm      AB  3   Living  3     SAB  1   IAB  2   Ectopic      Multiple  0   Live Births  1           Social History Social History   Socioeconomic History  . Marital status: Unknown    Spouse name: Not on file  . Number of children: Not on file  . Years of education: Not on file  . Highest education level: Not on file  Occupational History  . Not on file  Tobacco Use  . Smoking status: Former Smoker    Quit date: 02/28/2020    Years since quitting: 0.6  . Smokeless tobacco: Never Used  Vaping Use  . Vaping Use: Never used  Substance and Sexual Activity  . Alcohol use: Not Currently  . Drug use: Never  . Sexual activity: Yes    Birth control/protection: None  Other Topics Concern  . Not on file  Social History Narrative  . Not on file   Social Determinants of Health   Financial Resource Strain: Not on file  Food Insecurity: Not on file  Transportation Needs: Not on file  Physical Activity: Not on file  Stress: Not on file  Social Connections: Not on file    Family History: Family History   Problem Relation Age of Onset  . Breast cancer Other     Allergies: No Known Allergies  No medications prior to admission.     Review of Systems   All systems reviewed and negative except as stated in HPI  Blood pressure 118/72, pulse 88, temperature 98.2 F (36.8 C), temperature source Oral, resp. rate 16, height 5\' 4"  (1.626 m), weight 77.3 kg, last menstrual period 01/22/2020, SpO2 99 %, unknown if currently breastfeeding. General appearance: alert, cooperative and appears stated age Lungs: normal WOB Heart: regular rate Abdomen: soft, non-tender Extremities: no sign of DVT Presentation: cephalic Fetal monitoringBaseline: 120 bpm, Variability: Good {> 6 bpm), Accelerations: Reactive and Decelerations: intermittent variable decels Uterine activityFrequency: Every 2-4 minutes Dilation: 10 Effacement (%): 90 Station: 0 Exam by:: April Verkler, RN  Prenatal labs: ABO, Rh: --/--/O POS (01/30 2036) Antibody: NEG (01/30 2036) Rubella: 1.14 (01/30 2237) RPR: NON REACTIVE (01/30 2237)  HBsAg: NON REACTIVE (01/30 2237)  HIV: Non Reactive (01/30 2036)  GBS:   negative 1 hr Glucola: not performed Genetic screening: not performed Anatomy US wnl except for mild polyhydramnios, 2 intramural myomas  Prenatal Transfer Tool  Maternal Diabetes: No (no  gtt but A1c wnl in third trimester) Genetic Screening: not performed Maternal Ultrasounds/Referrals: Normal except for mild polyhydramnios (resolved) Fetal Ultrasounds or other Referrals:  Referred to Materal Fetal Medicine for AMA Maternal Substance Abuse:  Yes:  Type: Marijuana Significant Maternal Medications:  None Significant Maternal Lab Results: Group B Strep negative, prenatal labs ordered on admission (limited prenatal care)  No results found for this or any previous visit (from the past 24 hour(s)).  Patient Active Problem List   Diagnosis Date Noted  . Drug use affecting pregnancy 10/30/2020  . Supervision of high  risk pregnancy, antepartum 10/30/2020  . Vaginal delivery 10/30/2020  . Insufficient prenatal care, third trimester 10/25/2020  . Polyhydramnios affecting pregnancy 07/01/2020  . Supervision of other normal pregnancy, antepartum 04/29/2020  . AMA (advanced maternal age) multigravida 35+ 04/29/2020    Assessment/Plan:  Laurie Scott is a 40 y.o. [id number] at [redacted]w[redacted]d here for spontaneous onset of labor.  #Labor: will manage expectantly and augment as clinically indicated. #Pain: TBD per pt request #FWB: Category 2 strip secondary to intermittent variable decels; reassuringly, moderate variability with +accels. However, will discuss with pt that Category 2 strip is a contraindication to waterbirth. #ID: GBS negative #MOF: breast #MOC: undecided #Circ: n/a #Limited PNC: plan for SW consult in postpartum period  Dashawn Bartnick, Gildardo Cranker, MD OB Fellow, [facility]

## 2020-10-30 NOTE — Lactation Note (Addendum)
This note was copied from a baby's chart. Lactation Consultation Note  Patient Name: Laurie Scott [id number] Date: 10/30/2020 Reason for consult: L&D Initial assessment;Term Age:42 hours  (L&D- no charge for [facility] services) P3, term female infant. Per mom, she couldn't get her 1st child to latch and with her 2nd child who is now 7 years she formula fed only. Mom does have DEBP at home. LC entered the room, mom was doing STS with infant and infant was cuing to breastfeed. LC discussed infant primal hunger cues: licking, tasting, smacking, hands to mouth and rooting. Mom knows to BF infant according to these cues, 8 to 12+ times within 24 hours, STS. LC politely asked mom permission to touch her breast and assisted mom with breast stimulation, to help evert nipple shaft out more due to mom having flat nipples.  Mom will use hand pump to pre-pump breast or do breast stimulation prior to latching infant to help evert nipple shaft out more to help with latch. Mom latched infant on her left breast using the football hold position, infant latched with depth and breastfed for 12 minutes. Mom knows to call RN or [facility] services if she needs further assistance with latching infant at the breast. LC discussed infant 's input and out put with parents. LC briefly mention LC services after hospital discharge. Maternal Data Formula Feeding for Exclusion: No Has patient been taught Hand Expression?: Yes Does the patient have breastfeeding experience prior to this delivery?: Yes  Feeding Feeding Type: Breast Fed  LATCH Score Latch: Grasps breast easily, tongue down, lips flanged, rhythmical sucking.  Audible Swallowing: Spontaneous and intermittent  Type of Nipple: Flat  Comfort (Breast/Nipple): Soft / non-tender  Hold (Positioning): Assistance needed to correctly position infant at breast and maintain latch.  LATCH Score: 8  Interventions Interventions: Breast feeding basics reviewed;Assisted  with latch;Skin to skin;Adjust position;Breast massage;Support pillows;Hand express;Position options;Pre-pump if needed;Hand pump;Breast compression  Lactation Tools Discussed/Used Pump Education: Setup, frequency, and cleaning;Milk Storage Initiated by:: RN Date initiated:: 10/30/20   Consult Status Consult Status: Follow-up Date: 10/31/20 Follow-up type: In-patient    Danelle Earthly 10/30/2020, 10:24 PM

## 2020-10-30 NOTE — Discharge Summary (Addendum)
Postpartum Discharge Summary     Patient Name: Laurie Scott DOB: 21-Apr-1981 MRN: [id number]  Date of admission: 10/30/2020 Delivery date:10/30/2020  Delivering provider: Randa Ngo  Date of discharge: 11/01/2020  Admitting diagnosis: Supervision of high risk pregnancy, antepartum [O09.90] Intrauterine pregnancy: [redacted]w[redacted]d     Secondary diagnosis:  Principal Problem:   Vaginal delivery Active Problems:   AMA (advanced maternal age) multigravida 35+   Polyhydramnios affecting pregnancy   Insufficient prenatal care, third trimester   Drug use affecting pregnancy   Supervision of high risk pregnancy, antepartum  Additional problems: as noted above   Discharge diagnosis: Term Vaginal delivery                                          Post partum procedures:N/A Augmentation: none Complications: None  Hospital course: Onset of Labor With Vaginal Delivery      40 y.o. yo P5T6144 at [redacted]w[redacted]d was admitted in Latent Labor on 10/30/2020. Patient had an uncomplicated precipitous labor course as follows:  Membrane Rupture Time/Date: 9:20 PM ,10/30/2020   Delivery Method:Vaginal, Spontaneous  Episiotomy: None  Lacerations:  Periurethral (hemostatic) Patient had an uncomplicated postpartum course.  She is ambulating, tolerating a regular diet, passing flatus, and urinating well. Patient is discharged home in stable condition on 11/01/20.  Newborn Data: Birth date:10/30/2020  Birth time:9:23 PM  Gender:Female  Living status:Living  Apgars:9 ,9  Weight:2980 g   Magnesium Sulfate received: No BMZ received: No Rhophylac:N/A RXV:[id number] collected on admission T-DaP:offered prior to discharge Flu: offered prior to discharge Transfusion:No  Physical exam  Vitals:   10/31/20 0800 10/31/20 1500 10/31/20 2030 11/01/20 0500  BP: 109/61 125/76 124/68 118/72  Pulse: 73 89 90 88  Resp: $Remo'17 16 15 16  'MFYMu$ Temp: 98.4 F (36.9 C) 98.1 F (36.7 C) 98.2 F (36.8 C) 98.2 F (36.8 C)  TempSrc:    Oral Oral  SpO2: 97% 97% 99%   Weight:      Height:       General: alert, cooperative and no distress Lochia: appropriate Uterine Fundus: firm Incision: N/A DVT Evaluation: No evidence of DVT seen on physical exam. Labs: Lab Results  Component Value Date   WBC 13.0 (H) 10/30/2020   HGB 12.8 10/30/2020   HCT 40.5 10/30/2020   MCV 85.1 10/30/2020   PLT 273 10/30/2020   No flowsheet data found. Edinburgh Score: Edinburgh Postnatal Depression Scale Screening Tool 10/31/2020  I have been able to laugh and see the funny side of things. 0  I have looked forward with enjoyment to things. 0  I have blamed myself unnecessarily when things went wrong. 1  I have been anxious or worried for no good reason. 2  I have felt scared or panicky for no good reason. 1  Things have been getting on top of me. 1  I have been so unhappy that I have had difficulty sleeping. 1  I have felt sad or miserable. 1  I have been so unhappy that I have been crying. 0  The thought of harming myself has occurred to me. 0  Edinburgh Postnatal Depression Scale Total 7     After visit meds:  Allergies as of 11/01/2020   No Known Allergies     Medication List    STOP taking these medications   docusate sodium 100 MG capsule Commonly known as: COLACE  polyethylene glycol powder 17 GM/SCOOP powder Commonly known as: GLYCOLAX/MIRALAX     TAKE these medications   coconut oil Oil Apply 1 application topically as needed.   ibuprofen 800 MG tablet Commonly known as: ADVIL Take 1 tablet (800 mg total) by mouth every 8 (eight) hours as needed.        Discharge home in stable condition Infant Feeding: Breast Infant Disposition:home with mother Discharge instruction: per After Visit Summary and Postpartum booklet. Activity: Advance as tolerated. Pelvic rest for 6 weeks.  Diet: routine diet Future Appointments: No future appointments. Follow up Visit: Message sent to [facility] on 10/30/20 to  schedule PP appt.  Please schedule this patient for a In person postpartum visit in 6 weeks with the following provider: Any provider. Additional Postpartum F/U:none  Low risk pregnancy complicated by: limited PNC, AMA, mild polyhydraminos(resolved) Delivery mode:  Vaginal, Spontaneous  Anticipated Birth Control:  Unsure   11/01/2020 Shary Key, DO  I saw and evaluated the patient. I agree with the findings and the plan of care as documented in the resident's note.  Sharene Skeans, MD [facility] Family Medicine Fellow, [facility] for [facility], [place]

## 2020-10-30 NOTE — MAU Note (Signed)
Patient reports irregular contractions.  Denies LOF/VB.  Some brown discharge since last night.  + FM.   Patient reports she's planning a waterbirth but isn't sure she has the right paperwork for it.

## 2020-10-30 NOTE — Discharge Instructions (Signed)

## 2020-10-30 NOTE — MAU Note (Signed)
Up to bathroom to urinate.

## 2020-10-30 NOTE — MAU Note (Signed)
Pt sitting up with contractions-FHR loss of conduction-traces maternal heart rate

## 2020-10-31 LAB — RPR: RPR Ser Ql: NONREACTIVE

## 2020-10-31 NOTE — Clinical Social Work Maternal (Signed)
CLINICAL SOCIAL WORK MATERNAL/CHILD NOTE  Patient Details  Name: Laurie Scott MRN: [id number] Date of Birth: 12/02/1980  Date:  Sep 15, 2021  Clinical Social Worker Initiating Note:  Laurey Arrow Date/Time: Initiated:  10/31/20/1200     Child's Name:  Laurie Scott   Biological Parents:  Mother,Father   Need for Interpreter:  None   Reason for Referral:  Current Substance Use/Substance Use During Pregnancy    Address:  [address] [place] [address]    Phone number:  [phone] (home)     Additional phone number: FOB's number is [phone]  Household Members/Support Persons (HM/SP):   Household Member/Support Person 1,Household Member/Support Person 2   HM/SP Name Relationship DOB or Age  HM/SP -[place] FOB/Husband 09/29/1976  HM/SP -2 Laurie Scott son 01/09/11  HM/SP -3        HM/SP -4        HM/SP -5        HM/SP -6        HM/SP -7        HM/SP -8          Natural Supports (not living in the home):  Extended [employer]   Professional Supports: None   Employment: Full-time,Self-employed   Type of Work: 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   Education:  Vocation/technical training   Homebound arranged:    Financial Resources:  Medicaid   Other Resources:      Cultural/Religious Considerations Which May Impact Care:  None reported  Strengths:  Ability to meet basic needs ,Home prepared for child    Psychotropic Medications:         Pediatrician:       Pediatrician List:   [place]      Pediatrician Fax Number:    Risk Factors/Current Problems:  Substance Use    Cognitive State:  Alert ,Insightful ,Linear Thinking ,Able to Concentrate    Mood/Affect:  Bright ,Happy ,Interested ,Calm ,Comfortable ,Relaxed    CSW Assessment: CSW met with MOB to complete an  assessment for SA hx. When CSW arrived, FOB was holding infant and MOB was relaxing in bed watching TV.  CSW explained CSW's role and with MOB's permission, CSW asked FOB to leave the room in order to assess MOB in private; FOB left without incident. MOB held infant during the assessment and was attentive to infant cues; MOB and infant appeared happy and comfortable. MOB was polite, forthcoming, and easy to engage.   CSW asked about MOB's SA hx.  MOB openly shared taking CBD's ("Delta 8") throughout her pregnancy. Per MOB, she utilized CBD's to assist with sleep, decrease her anxiety (MOB reported not having a formal dx, and decrease her nausea. CSW educated MOB on the hospital's policy and procedure regarding substance use and mandated reporting for positive screens. MOB verbalized understanding. This Probation officer informed MOB that if UDS or CDS come back positive she will be notified and a report will be made to [facility]. MOB verbalized understanding and noted no further questions MOB expressed being familiar with CPS process and reported she had an open case "About a year ago," after a verbal altercation with FOB. MOB reported that her CPS case was closed after the initial assessment. CSW assessed for safety and MOB reported feeling safe. CSW assessed for other psychosocial stressors and  MOB denied them all.  MOB notes she has no further needs and has everything she needs for baby's arrival home to include car seat, crib, and clothing items. At this time, CSW has no barriers to d/c.   CSW Plan/Description:  No Further Intervention Required/No Barriers to Discharge,Sudden Infant Death Syndrome (SIDS) Education,Perinatal Mood and Anxiety Disorder (PMADs) Education,Other Patient/Family Education,Hospital Drug Screen Policy Information,Other Information/Referral to [employer]   After completing chart review, MOB had adequate prenatal care and MOB denied barriers to follow-up appointments.    Laurey Arrow, MSW, LCSW Clinical Social Work [phone]   Dimple Nanas, LCSW 10/31/2020, 12:04 PM

## 2020-10-31 NOTE — Progress Notes (Addendum)
Patient ID: DEANN MCLAINE, female   DOB: 06-14-1981, 40 y.o.   MRN: [id number] POSTPARTUM PROGRESS NOTE  Subjective: TRESSY KUNZMAN is a 40 y.o. W6O0355 s/p VD at [redacted]w[redacted]d.  She reports doing well. No acute events overnight. She denies any problems with ambulating, voiding or po intake. Denies nausea or vomiting. She is passing flatus. Pain is well controlled.  Lochia is minimal. She feels comfortable going home today if baby gets discharged.  Objective: Blood pressure 110/68, pulse 63, temperature 97.6 F (36.4 C), temperature source Oral, resp. rate 16, height 5\' 4"  (1.626 m), weight 77.3 kg, last menstrual period 01/22/2020, SpO2 98 %, unknown if currently breastfeeding.  Physical Exam:  General: alert, cooperative and no distress Chest: no respiratory distress Abdomen: soft, non-tender  Uterine Fundus: firm and at level of umbilicus Extremities: No calf swelling or tenderness. No b/l LE edema  Recent Labs    10/28/20 0959 10/30/20 2036  HGB 11.3* 12.8  HCT 34.3* 40.5    Assessment/Plan: JUMANAH HYNSON is a 40 y.o. H7C1638 s/p VD at [redacted]w[redacted]d.  Routine Postpartum Care: Doing well, pain well-controlled.  -- Continue routine care, lactation support  -- Contraception: Vasectomy -- Feeding: Breast; Lactation consulted -- Late PNC: SW consulted  Return precautions discussed.  Dispo: Plan for discharge PPD #2.  Lowella Curb, Medical Student 10/31/2020 7:50 AM  Midwife attestation I have seen and examined this patient and agree with above documentation in the student's note.   Post Partum Day 1  JAKAILA NORMENT is a 40 y.o. G5X6468 s/p SVD.  Pt denies problems with ambulating, voiding or po intake. Pain is well controlled. Method of Feeding: breast  PE:  Gen: well appearing Heart: reg rate Lungs: normal WOB Fundus firm Ext: soft, no pain, no edema  Assessment: S/p SVD PPD #1  Plan for discharge: tomorrow SW consult for MJ use  Julianne Handler,  CNM 11:10 AM

## 2020-10-31 NOTE — Lactation Note (Signed)
This note was copied from a baby's chart. Lactation Consultation Note Baby 58 hrs old. Mom has 2 other children that she didn't BF. Mom stated she wants to BF this baby. Mom stated her nipples are sore. Mom showed [place] her nipples. Short shaft compressible nipples are bruised. LC gave mom shells to assist in everting nipples. Mom has hand pump to use for pre-pumping if needed. Lt. Nipple appears shorter than Rt. Hand expression demonstrated colostrum. Encouraged mom to pat on nipples. Comfort gels given.  Assisted in latching. Encouraged since has short shaft and is sore to latch in football hold. First few suck are tender then gets better.  Discussed props, and positioning, body alignment during feedings. Newborn feeding habits, STS, I&O, breast massage, supply and demand discussed. Mom encouraged to feed baby 8-12 times/24 hours and with feeding cues.   Encouraged mom to call for assistance or questions. Lactation brochure given.  Patient Name: Laurie Scott [id number] Date: 10/31/2020 Reason for consult: Initial assessment;Term;1st time breastfeeding Age:10 hours  Maternal Data Has patient been taught Hand Expression?: Yes Does the patient have breastfeeding experience prior to this delivery?: Yes  Feeding Feeding Type: Breast Fed  LATCH Score Latch: Grasps breast easily, tongue down, lips flanged, rhythmical sucking.  Audible Swallowing: A few with stimulation  Type of Nipple: Everted at rest and after stimulation (short shaft)  Comfort (Breast/Nipple): Filling, red/small blisters or bruises, mild/mod discomfort (bruised)  Hold (Positioning): Assistance needed to correctly position infant at breast and maintain latch.  LATCH Score: 7  Interventions Interventions: Breast feeding basics reviewed;Adjust position;Assisted with latch;Support pillows;Skin to skin;Position options;Breast massage;Hand express;Breast compression;Shells  Lactation Tools  Discussed/Used Tools: Shells;Pump Shell Type: Inverted Breast pump type: Manual   Consult Status Consult Status: Follow-up Date: 11/01/20 Follow-up type: In-patient    Theodoro Kalata 10/31/2020, 10:40 PM

## 2020-11-01 ENCOUNTER — Other Ambulatory Visit: Payer: Medicaid Other

## 2020-11-01 LAB — OBSTETRIC PANEL
Absolute Monocytes: 579 cells/uL (ref 200–950)
Antibody Screen: NOT DETECTED
Basophils Absolute: 36 cells/uL (ref 0–200)
Basophils Relative: 0.4 %
Eosinophils Absolute: 27 cells/uL (ref 15–500)
Eosinophils Relative: 0.3 %
HCT: 34.3 % — ABNORMAL LOW (ref 35.0–45.0)
Hemoglobin: 11.3 g/dL — ABNORMAL LOW (ref 11.7–15.5)
Hepatitis B Surface Ag: NONREACTIVE
Lymphs Abs: 1344 cells/uL (ref 850–3900)
MCH: 28.1 pg (ref 27.0–33.0)
MCHC: 32.9 g/dL (ref 32.0–36.0)
MCV: 85.3 fL (ref 80.0–100.0)
MPV: 11.5 fL (ref 7.5–12.5)
Monocytes Relative: 6.5 %
Neutro Abs: 6915 cells/uL (ref 1500–7800)
Neutrophils Relative %: 77.7 %
Platelets: 249 10*3/uL (ref 140–400)
RBC: 4.02 10*6/uL (ref 3.80–5.10)
RDW: 12.9 % (ref 11.0–15.0)
RPR Ser Ql: NONREACTIVE
Rubella: 1 Index
Total Lymphocyte: 15.1 %
WBC: 8.9 10*3/uL (ref 3.8–10.8)

## 2020-11-01 LAB — HEMOGLOBIN A1C
Hgb A1c MFr Bld: 5.5 % of total Hgb (ref <5.7)
Mean Plasma Glucose: 111 mg/dL
eAG (mmol/L): 6.2 mmol/L

## 2020-11-01 LAB — DRUG MONITOR, PANEL 3, W/CONF, URINE
Amphetamines: NEGATIVE ng/mL (ref <500)
Benzodiazepines: NEGATIVE ng/mL (ref <100)
Cocaine Metabolite: NEGATIVE ng/mL (ref <150)
Creatinine: 111.1 mg/dL
Marijuana Metabolite: >5000 ng/mL — ABNORMAL HIGH (ref <5)
Marijuana Metabolite: POSITIVE ng/mL — AB (ref <20)
Opiates: NEGATIVE ng/mL (ref <100)
Oxidant: NEGATIVE ug/mL
Oxycodone: NEGATIVE ng/mL (ref <100)
pH: 7.5 (ref 4.5–9.0)

## 2020-11-01 LAB — HIV ANTIBODY (ROUTINE TESTING W REFLEX): HIV 1&2 Ab, 4th Generation: NONREACTIVE

## 2020-11-01 LAB — HEMOGLOBINOPATHY EVALUATION
Fetal Hemoglobin Testing: <1 % (ref 0.0–1.9)
HCT: 35.8 % (ref 35.0–45.0)
Hemoglobin A2 - HGBRFX: 2.4 % (ref 2.2–3.2)
Hemoglobin: 10.9 g/dL — ABNORMAL LOW (ref 11.7–15.5)
Hgb A: 97.6 % (ref >96.0)
MCH: 26.8 pg — ABNORMAL LOW (ref 27.0–33.0)
MCV: 88.2 fL (ref 80.0–100.0)
RBC: 4.06 10*6/uL (ref 3.80–5.10)
RDW: 13.1 % (ref 11.0–15.0)

## 2020-11-01 LAB — DM TEMPLATE

## 2020-11-01 LAB — RUBELLA SCREEN: Rubella: 1.14 index (ref >0.99)

## 2020-11-01 LAB — HEPATITIS C ANTIBODY
Hepatitis C Ab: NONREACTIVE
SIGNAL TO CUT-OFF: 0.2 (ref <1.00)

## 2020-11-01 MED ORDER — IBUPROFEN 800 MG PO TABS: 800.0000 mg | ORAL_TABLET | Freq: Three times a day (TID) | ORAL | 0 refills | Status: AC | PRN

## 2020-11-01 MED ORDER — COCONUT OIL OIL: 1.0000 "application " | TOPICAL_OIL | 0 refills | Status: AC | PRN

## 2020-11-01 NOTE — Lactation Note (Signed)
This note was copied from a baby's chart. Lactation Consultation Note  Patient Name: Laurie Scott [id number] Date: 11/01/2020 Reason for consult: Follow-up assessment Age:42 hours   Mother is a P3, but this is her first  Infant to breastfeed. Mother has become very sore. Both nipples are extrermly pink . She has comfort gels, coconut oil and shells at the bedside.   Assist mother with rotating positions. Changed her to cross cradle hold and taught mother the asymmetrical latch technique. Infant sustained latch for 65min and another 10 mins. Observed deeper latch and mother reports less discomfort.   Encouraged mother to use good alignment and good pillow support.  Encouraged mother to use ebm to apply to nipples and allow to air dry.  Suggested pumping and taking a break for a few hours and offer infant feeding with curved tip syringe. Also suggested to phone OB to ask for APNO to quickly heal nipples.  Mother tired and very discouraged.  .  Discussed treatment and prevention of engorgement.  Mother to continue to cue base feed infant and feed at least 8-12 times or more in 24 hours and advised to allow for cluster feeding infant as needed.  Mother to continue to due STS. Mother is aware of available LC services at [facility], BFSG'S, OP Dept, and phone # for questions or concerns about breastfeeding.  Mother receptive to all teaching and plan of care.    Maternal Data    Feeding Mother's Current Feeding Choice: Breast Milk  LATCH Score Latch: Grasps breast easily, tongue down, lips flanged, rhythmical sucking.  Audible Swallowing: Spontaneous and intermittent  Type of Nipple: Everted at rest and after stimulation  Comfort (Breast/Nipple): Filling, red/small blisters or bruises, mild/mod discomfort  Hold (Positioning): Assistance needed to correctly position infant at breast and maintain latch.  LATCH Score: 8   Lactation Tools Discussed/Used    Interventions Interventions:  Adjust position;Breast compression;Skin to skin;Assisted with latch;Support pillows;Position options;Shells;Coconut oil;Comfort gels;Education  Discharge Discharge Education: Engorgement and breast care;Warning signs for feeding baby;Outpatient recommendation Pump: Manual  Consult Status Consult Status: Complete    Darla Lesches 11/01/2020, 11:21 AM

## 2020-11-02 ENCOUNTER — Telehealth: Payer: Self-pay | Admitting: *Deleted

## 2020-11-02 NOTE — Telephone Encounter (Signed)
Left patient an urgent message to call and schedule 6 week in office Postpartum appointment as soon as possible. 

## 2020-11-02 NOTE — Telephone Encounter (Signed)
-----   Message from Randa Ngo, MD sent at 10/30/2020  9:51 PM EST ----- Regarding: PP Visit  Please schedule this patient for a In person postpartum visit in 6 weeks with the following provider: Any provider. Additional Postpartum F/U:none  Low risk pregnancy complicated by: limited PNC, AMA, mild polyhydraminos(resolved) Delivery mode:  Vaginal, Spontaneous  Anticipated Birth Control:  Unsure

## 2020-11-04 ENCOUNTER — Encounter: Payer: Medicaid Other | Admitting: Certified Nurse Midwife

## 2020-11-09 ENCOUNTER — Telehealth: Payer: Self-pay | Admitting: *Deleted

## 2020-11-09 NOTE — Telephone Encounter (Signed)
Left patient an urgent message to call and schedule 6 week in office Postpartum appointment as soon as possible.

## 2020-11-24 ENCOUNTER — Telehealth: Payer: Self-pay | Admitting: *Deleted

## 2020-11-24 MED ORDER — AMBULATORY NON FORMULARY MEDICATION: 1.0000 | 5 refills | Status: AC | PRN

## 2020-11-24 NOTE — Telephone Encounter (Signed)
Pt called after consultation with lactation consultant.  She is requesting a RX for All purpose nipple cream.  This was sent to [facility] and pt is aware.

## 2020-12-12 NOTE — Progress Notes (Deleted)
    [place] Partum Visit Note  Laurie Scott is a 40 y.o. I0X6553 female who presents for a postpartum visit. She is 6 weeks postpartum following a normal spontaneous vaginal delivery.  I have fully reviewed the prenatal and intrapartum course. The delivery was at 40.2 gestational weeks.  Anesthesia: IV sedation. Postpartum course has been ***. Baby is doing well***. Baby is feeding by {breast/bottle:69}. Bleeding {vag bleed:12292}. Bowel function is {normal:32111}. Bladder function is {normal:32111}. Patient {is/is not:9024} sexually active. Contraception method is {contraceptive method:5051}. Postpartum depression screening: {gen negative/positive:315881}.   The pregnancy intention screening data noted above was reviewed. Potential methods of contraception were discussed. The patient elected to proceed with {Upstream End Methods:24109}.      {Common ambulatory SmartLinks:19316}  Review of Systems {ros; complete:30496}    Objective:  There were no vitals taken for this visit.   General:  {gen appearance:16600}   Breasts:  {breast exam:1202::"inspection negative, no nipple discharge or bleeding, no masses or nodularity palpable"}  Lungs: {lung exam:16931}  Heart:  {heart exam:5510}  Abdomen: {abdomen exam:16834}   Vulva:  {labia exam:12198}  Vagina: {vagina exam:12200}  Cervix:  {cervix exam:14595}  Corpus: {uterus exam:12215}  Adnexa:  {adnexa exam:12223}  Rectal Exam: {rectal/vaginal exam:12274}        Assessment:    *** postpartum exam. Pap smear {done:10129} at today's visit.   Plan:   Essential components of care per ACOG recommendations:  1.  Mood and well being: Patient with {gen negative/positive:315881} depression screening today. Reviewed local resources for support.  - Patient {Action; does/does not:19097} use tobacco. ***If using tobacco we discussed reduction and for recently cessation risk of relapse - hx of drug use? {yes/no:20286}  *** If yes, discussed  support systems  2. Infant care and feeding:  -Patient currently breastmilk feeding? {yes/no:20286} ***If breastmilk feeding discussed return to work and pumping. If needed, patient was provided letter for work to allow for every 2-3 hr pumping breaks, and to be granted a private location to express breastmilk and refrigerated area to store breastmilk. Reviewed importance of draining breast regularly to support lactation. -Social determinants of health (SDOH) reviewed in EPIC. No concerns***The following needs were identified***  3. Sexuality, contraception and birth spacing - Patient {DOES_DOES ZSM:27078} want a pregnancy in the next year.  Desired family size is {NUMBER 1-10:22536} children.  - Reviewed forms of contraception in tiered fashion. Patient desired {PLAN CONTRACEPTION:313102} today.   - Discussed birth spacing of 18 months  4. Sleep and fatigue -Encouraged family/partner/community support of 4 hrs of uninterrupted sleep to help with mood and fatigue  5. Physical Recovery  - Discussed patients delivery*** and complications - Patient had a *** degree laceration, perineal healing reviewed. Patient expressed understanding - Patient has urinary incontinence? {yes/no:20286}*** Patient was referred to pelvic floor PT  - Patient {ACTION; IS/IS MLJ:44920100} safe to resume physical and sexual activity  6.  Health Maintenance - Last pap smear done *** and was {Normal/abnormal wildcard:19619} with negative HPV. ***Mammogram  7. ***Chronic Disease - PCP follow up  Lyndal Rainbow, [place] for [place]

## 2020-12-13 ENCOUNTER — Ambulatory Visit: Payer: Medicaid Other | Admitting: Certified Nurse Midwife

## 2021-07-13 IMAGING — US US MFM FETAL BPP W/O NON-STRESS
1 series · 14 of 25 positions shown · non-contrast
Comparison: none

[Series 1: us mfm fetal bpp w/o non-stress · 25 acquisitions, 14 frames shown]
[im 1/25]
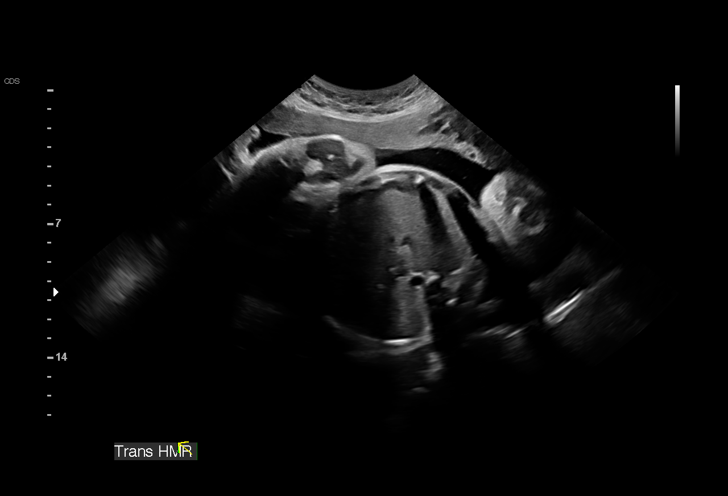
[im 3/25]
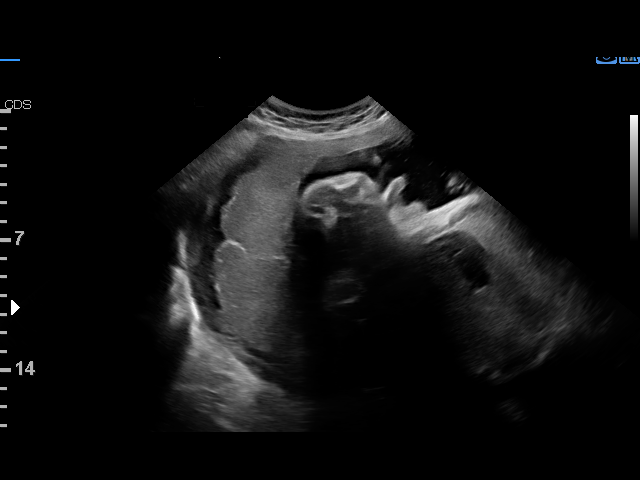
[im 5/25]
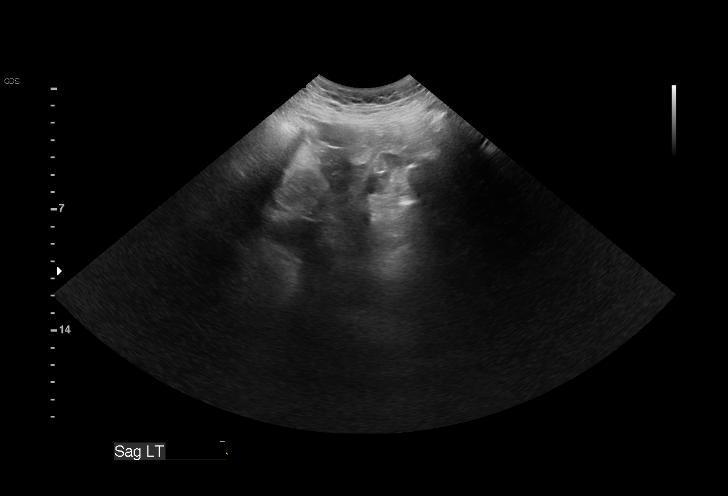
[im 7/25]
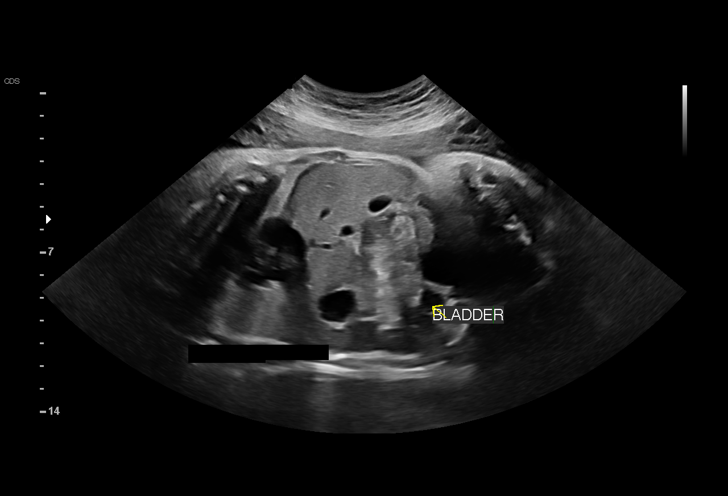
[im 9/25]
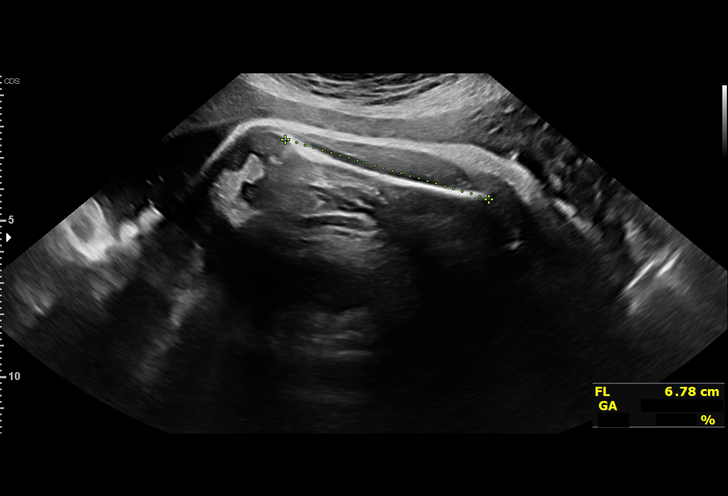
[im 10/25]
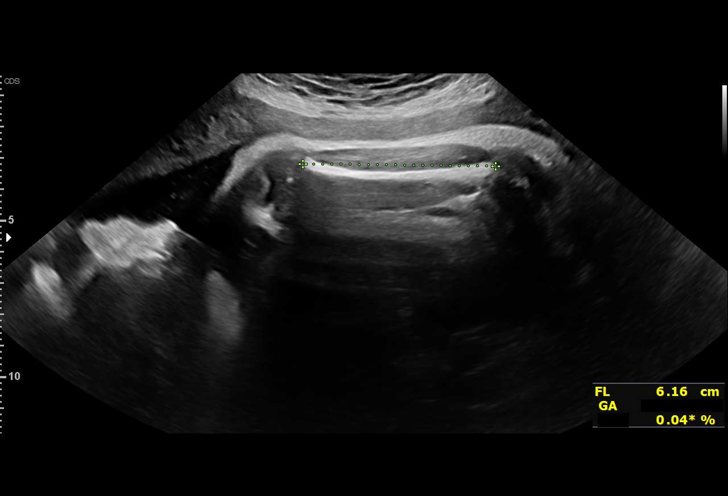
[im 12/25]
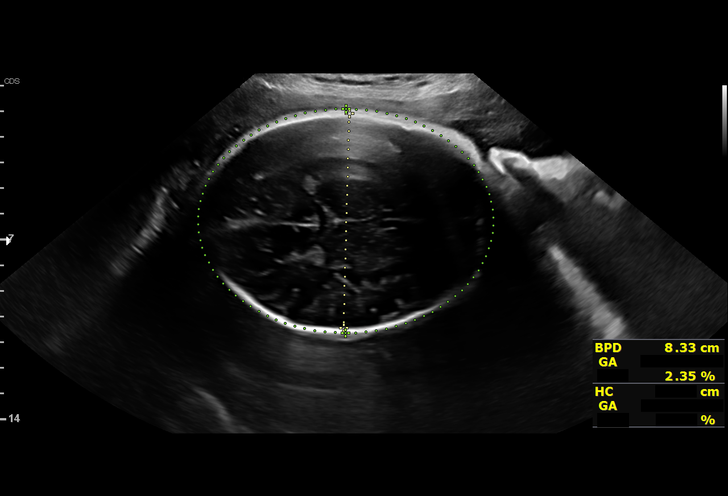
[im 14/25]
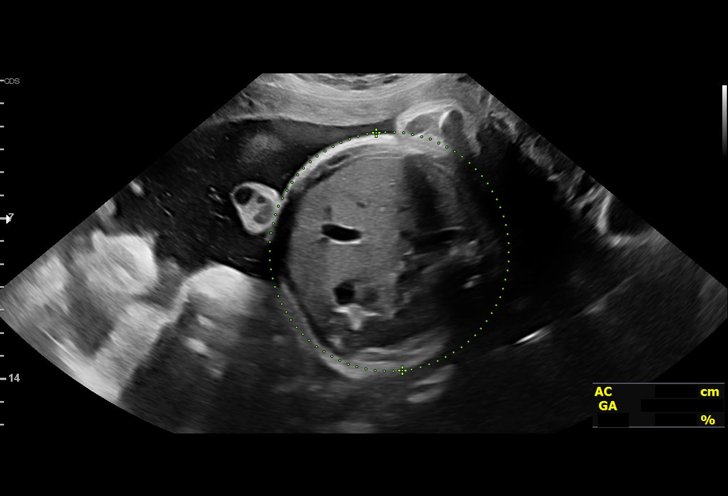
[im 16/25]
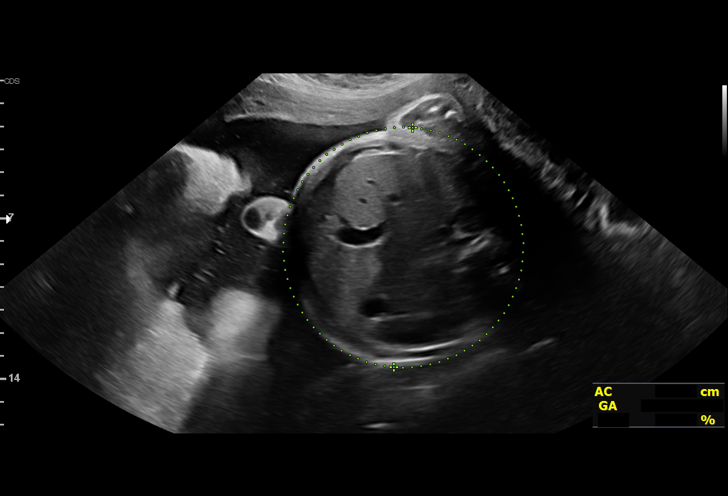
[im 17/25]
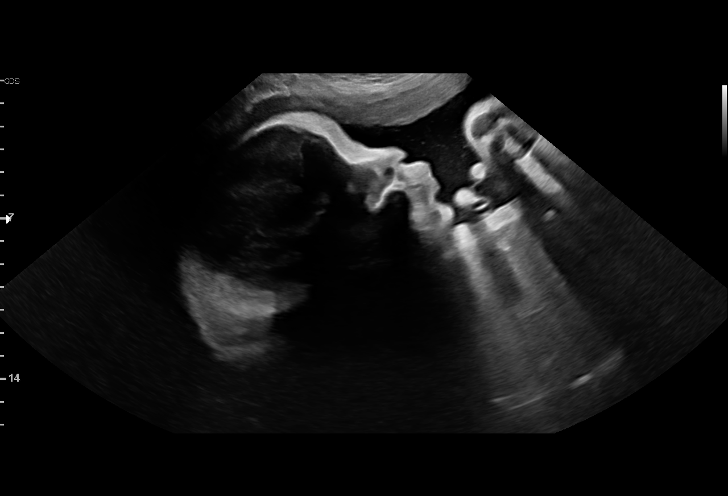
[im 19/25]
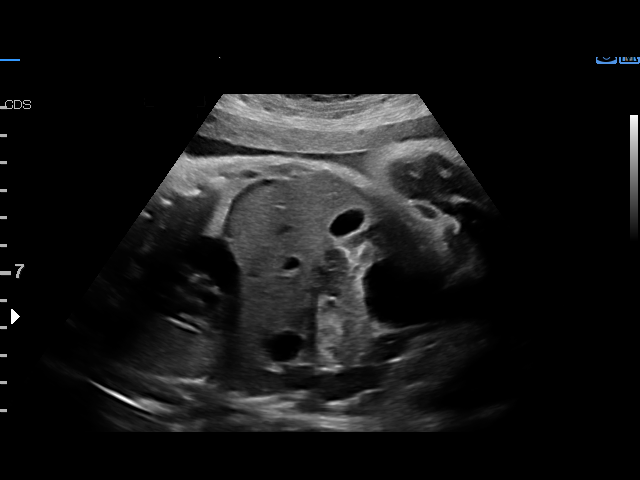
[im 21/25]
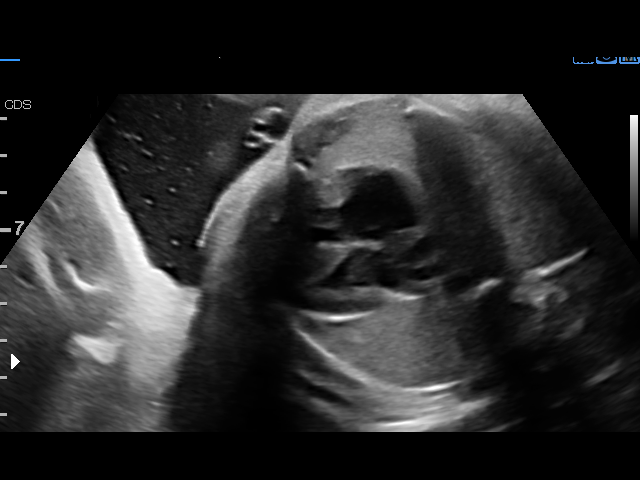
[im 23/25]
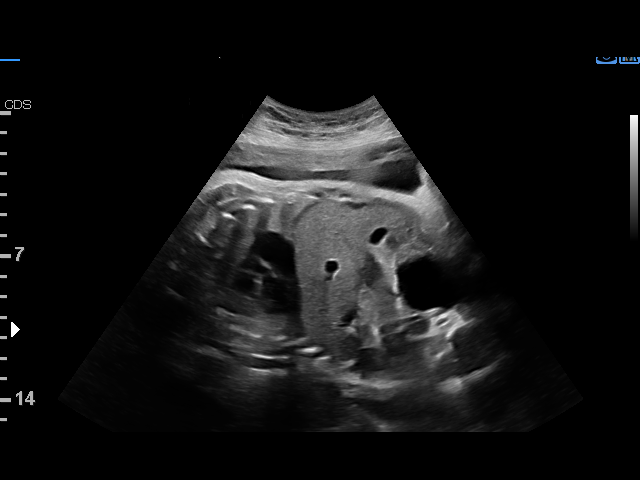
[im 25/25]
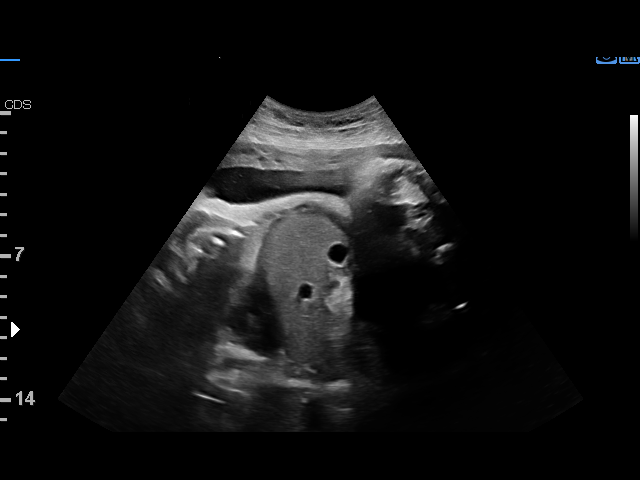

[14 of 25 positions shown; findings below may reference images not displayed]

[facility] CNM

                                                      PENALES

Indications

 36 weeks gestation of pregnancy
 Advanced maternal age multigravida 35+,
 third trimester (39 yrs)
 Uterine fibroids
 Encounter for other antenatal screening
 follow-up
Fetal Evaluation

 Num Of Fetuses:         1
 Fetal Heart Rate(bpm):  125
 Cardiac Activity:       Observed
 Presentation:           Transverse, head to maternal left
 Placenta:               Posterior
 P. Cord Insertion:      Previously Visualized

 Amniotic Fluid
 AFI FV:      Within normal limits

 AFI Sum(cm)     %Tile       Largest Pocket(cm)
 10.14           24

 RUQ(cm)       RLQ(cm)       LUQ(cm)        LLQ(cm)

Biophysical Evaluation

 Amniotic F.V:   Pocket => 2 cm             F. Tone:        Observed
 F. Movement:    Observed                   Score:          [DATE]
 F. Breathing:   Observed
Biometry

 BPD:      83.5  mm     G. Age:  33w 4d        2.6  %    CI:        68.74   %    70 - 86
                                                         FL/HC:      20.2   %    20.8 -
 HC:      321.8  mm     G. Age:  36w 2d         18  %    HC/AC:      0.99        0.92 -
 AC:      325.4  mm     G. Age:  36w 3d         59  %    FL/BPD:     77.7   %    71 - 87
 FL:       64.9  mm     G. Age:  33w 3d        1.3  %    FL/AC:      19.9   %    20 - 24

 Est. FW:    8114  gm    5 lb 14 oz      23  %
OB History

 Gravidity:    6         Term:   2         SAB:   1
 TOP:          2        Living:  2
Gestational Age

 LMP:           36w 4d        Date:  01/22/20                 EDD:   10/28/20
 U/S Today:     35w 0d                                        EDD:   11/08/20
 Best:          36w 4d     Det. By:  LMP  (01/22/20)          EDD:   10/28/20
Anatomy

 Cranium:               Appears normal         LVOT:                   Previously seen
 Cavum:                 Previously seen        Aortic Arch:            Previously seen
 Ventricles:            Previously seen        Ductal Arch:            Previously seen
 Choroid Plexus:        Previously seen        Diaphragm:              Appears normal
 Cerebellum:            Previously seen        Stomach:                Appears normal, left
                                                                       sided
 Posterior Fossa:       Previously seen        Abdomen:                Appears normal
 Nuchal Fold:           Not applicable (>20    Abdominal Wall:         Previously seen
                        wks GA)
 Face:                  Orbits and profile     Cord Vessels:           Previously seen
                        previously seen
 Lips:                  Previously seen        Kidneys:                Appear normal
 Palate:                Not well visualized    Bladder:                Appears normal
 Thoracic:              Appears normal         Spine:                  Previously seen
 Heart:                 Appears normal         Upper Extremities:      Previously seen
                        (4CH, axis, and
                        situs)
 RVOT:                  Appears normal         Lower Extremities:      Previously seen

 Other:  Fetus appears to be female. Heels visualized. Open hands visualized.
         Nasal bone visualized. Hands visualized. Technically difficult due to
         fetal position and movement.
Cervix Uterus Adnexa

 Cervix
 Not visualized (advanced GA >21wks)
 Uterus
 No abnormality visualized.

 Right Ovary
 Not visualized.

 Left Ovary
 Not visualized.
Impression

 Follow up growth and antenatal testing due to AMA 38 yo
 Normal interval growth with measurements consistent with
 dates
 Good fetal movement and amniotic fluid volume
 Biophysical profile [DATE].

 Ms. Joni-Pekka notes that she hasn't had prenatal appointments
 in over 1 month. I discussed the small increased risk for
 stillbirth and the recommendation for Norya Tewelde and
 weekly testing. She is planning to have these test performed
 at your offices. However, she has scheduled them here as
 well as an alternative if NST/AFI or BPP cannot be performed
 at your office.
Recommendations

 Continue weekly testing.
 Consider delivery between 39-40 week pending fetal status.
